# Patient Record
Sex: Female | Born: 1965 | Race: Black or African American | Hispanic: No | Marital: Married | Smoking: Never smoker
Health system: Southern US, Community
[De-identification: ages and names within clinical notes are randomized; demographics above are authoritative.]

## PROBLEM LIST (undated history)

## (undated) DIAGNOSIS — E785 Hyperlipidemia, unspecified: Secondary | ICD-10-CM

## (undated) DIAGNOSIS — E079 Disorder of thyroid, unspecified: Secondary | ICD-10-CM

## (undated) DIAGNOSIS — J45909 Unspecified asthma, uncomplicated: Secondary | ICD-10-CM

## (undated) DIAGNOSIS — D649 Anemia, unspecified: Secondary | ICD-10-CM

## (undated) DIAGNOSIS — I10 Essential (primary) hypertension: Secondary | ICD-10-CM

## (undated) DIAGNOSIS — G43909 Migraine, unspecified, not intractable, without status migrainosus: Secondary | ICD-10-CM

## (undated) DIAGNOSIS — K219 Gastro-esophageal reflux disease without esophagitis: Secondary | ICD-10-CM

## (undated) DIAGNOSIS — N189 Chronic kidney disease, unspecified: Secondary | ICD-10-CM

## (undated) HISTORY — DX: Hyperlipidemia, unspecified: E78.5

## (undated) HISTORY — PX: OTHER SURGICAL HISTORY: SHX169

## (undated) HISTORY — DX: Migraine, unspecified, not intractable, without status migrainosus: G43.909

## (undated) HISTORY — DX: Essential (primary) hypertension: I10

## (undated) HISTORY — DX: Disorder of thyroid, unspecified: E07.9

## (undated) HISTORY — DX: Gastro-esophageal reflux disease without esophagitis: K21.9

## (undated) HISTORY — PX: COLONOSCOPY: SHX174

## (undated) HISTORY — DX: Anemia, unspecified: D64.9

## (undated) HISTORY — DX: Unspecified asthma, uncomplicated: J45.909

## (undated) HISTORY — DX: Chronic kidney disease, unspecified: N18.9

---

## 2015-01-10 ENCOUNTER — Encounter: Payer: Self-pay | Admitting: Gastroenterology

## 2015-09-25 ENCOUNTER — Encounter: Payer: Self-pay | Admitting: Endocrinology

## 2015-09-25 ENCOUNTER — Ambulatory Visit (INDEPENDENT_AMBULATORY_CARE_PROVIDER_SITE_OTHER): Payer: 59 | Admitting: Endocrinology

## 2015-09-25 VITALS — BP 110/74 | HR 118 | Temp 98.6°F | Resp 14 | Ht 67.0 in | Wt 190.2 lb

## 2015-09-25 DIAGNOSIS — E890 Postprocedural hypothyroidism: Secondary | ICD-10-CM | POA: Insufficient documentation

## 2015-09-25 LAB — T3, FREE: T3 FREE: 4 pg/mL (ref 2.3–4.2)

## 2015-09-25 LAB — T4, FREE: Free T4: 1.54 ng/dL (ref 0.60–1.60)

## 2015-09-25 NOTE — Progress Notes (Addendum)
Patient ID: Heather Mcdaniel, female   DOB: [birth year], 49 y.o.   MRN: [id number]           Reason for Appointment:  Hypothyroidism, new visit    History of Present Illness:   In the late 1990s she apparently had hyperthyroidism with symptoms of feeling very tired and sleepy, 30 pound weight loss. She was treated with radioactive iodine She became hypothyroid soon after this and was started on thyroid supplements  She thinks she has been on very high doses of Synthroid or levothyroxine since the onset and has been followed by an endocrinologist previously in [place].  She has generally been feeling fairly good with her regimen of Synthroid or Generic levothyroxine over the last few years She has been followed regularly by her endocrinologist and her dose adjusted slightly at times She thinks she has been on the same dose of 450 g levothyroxine daily for about a year  The patient has been treated with brand name Synthroid previously, generic for the last year or so because of high cost of getting 2 prescriptions of the brand name at the same time She generally has prescriptions filled at [facility], either in [place] or [place]  She thinks she had thyroid levels checked less than a year ago with her endocrinologist and no change was made in her medication  Currently the patient does feel tired at times but not consistently, she thinks this is from more stress at work   Also at times they feel shaky or have palpitations She is having hot flashes and sweating which she thinks is from menopause over the last year She had lost significant amount of weight last year because of GI problems but has gained back 10 pounds in the last few months  Patient's weight history is as follows:  Wt Readings from Last 3 Encounters:  09/25/15 190 lb 3.2 oz (86.274 kg)    Thyroid function results have been as follows:  TSH done through PCP on 08/31/15 was <0.01 No other labs are available, free T4 not done  recently  No results found for: FREET4, TSH   Past Medical History  Diagnosis Date  . Hypertension   . Thyroid disease   . Asthma     History reviewed. No pertinent past surgical history.  Family History  Problem Relation Age of Onset  . Thyroid disease Mother   . Kidney disease Mother   . Thyroid disease Maternal Grandmother   . Cancer Father     Social History:  reports that she has never smoked. She has never used smokeless tobacco. Her alcohol and drug histories are not on file.  Allergies:  Allergies  Allergen Reactions  . Topamax [Topiramate] Shortness Of Breath  . Morphine And Related Anxiety      Medication List       This list is accurate as of: 09/25/15  9:41 AM.  Always use your most recent med list.               acyclovir 400 MG tablet  Commonly known as:  ZOVIRAX  Take 400 mg by mouth 2 (two) times daily.     amitriptyline 50 MG tablet  Commonly known as:  ELAVIL  TAKE 2 TABLETS IN THE EVENING AND 1/2 TABLET IN THE MORNING AS DIRECTED     diltiazem 90 MG 12 hr capsule  Commonly known as:  CARDIZEM SR  Take 90 mg by mouth 2 (two) times daily.     Flaxseed Oil  1000 MG Caps  Take by mouth.     levothyroxine 200 MCG tablet  Commonly known as:  SYNTHROID, LEVOTHROID  Take 400 mcg by mouth daily.     levothyroxine 100 MCG tablet  Commonly known as:  SYNTHROID, LEVOTHROID  Take 100 mcg by mouth daily. Takes 1/2 tablet daily     OMEGA-3 FISH OIL PO  Take by mouth.     promethazine 25 MG tablet  Commonly known as:  PHENERGAN  TAKE 1 TABLET DAILY AS NEEDED FOR NAUSEA ASSOCIATED WITH MIGRAINE     valsartan-hydrochlorothiazide 160-25 MG tablet  Commonly known as:  DIOVAN-HCT  Take 1 tablet by mouth daily.     VENTOLIN HFA 108 (90 BASE) MCG/ACT inhaler  Generic drug:  albuterol  INHALE 1-2 PUFFS EVERY DAY        Review of Systems:  Review of Systems  Constitutional: Positive for weight loss, malaise/fatigue and diaphoresis.          She had weight loss in 2015, apparently from some GI problems.  Her maximum weight previously has been 215.  In the last 2 weeks has gained back 10-12 pounds She has on and off feelings of tiredness Has had sweating spells and hot flashes for about a year, recently worse   HENT:       Occasional migraines  Eyes: Negative for blurred vision.  Respiratory: Negative for shortness of breath.   Cardiovascular: Positive for palpitations. Negative for chest pain.       Occasionally feels heart palpitations in the last year  Gastrointestinal: Negative for constipation.  Genitourinary: Negative for frequency.  Musculoskeletal: Positive for back pain.       Has mild lower back pain  Neurological: Positive for tremors and headaches.       Occasionally feels shaky  Endo/Heme/Allergies: Negative for polydipsia.  Psychiatric/Behavioral: The patient is nervous/anxious.                Examination:    BP 110/74 mmHg  Pulse 118  Temp(Src) 98.6 F (37 C)  Resp 14  Ht 5\' 7"  (1.702 m)  Wt 190 lb 3.2 oz (86.274 kg)  BMI 29.78 kg/m2  SpO2 96%  GENERAL:  Average build.  Pleasant, not anxious, well looking  No pallor, clubbing, cervical lymphadenopathy or edema.   Skin:  no rash or pigmentation.  EYES:  No prominence of the eyes or swelling of the eyelids.  No stare present.  Minimal lid lag  present.  ENT: Oral mucosa and tongue normal.  THYROID:  Not palpable.  HEART:  Normal  S1 and S2; no murmur or click.  Heart rate during exam was slightly variable with sinus arrhythmia, about 96, regular  CHEST:    Lungs: Vescicular breath sounds heard equally.  No crepitations/ wheeze.  ABDOMEN:  No distention.  Liver and spleen not palpable.  No other mass or tenderness.  NEUROLOGICAL: Reflexes are bilaterally risk at biceps.  JOINTS:  Normal.   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 She reportedly has required very large doses of thyroid supplements since onset  of her hypothyroidism Unclear why she has required large doses of thyroid supplements.  She is taking her medication appropriately before breakfast without any interacting drugs or over-the-counter supplements Apparently her dose was also about the same with brand name Synthroid  No previous records are available but she had been continued on the same dose of 450 g of levothyroxine this year by her previous endocrinologist  Currently the patient appears to have mild hyperthyroid symptoms with some palpitations, shakiness and heat intolerance Has not had any weight loss   PLAN:   Check free T4 and free T3 level today. If significantly high will need to reduce her levothyroxine by 100 g May continue Generic levothyroxine that she isn't able to afford brand name Synthroid  Follow-up in 6 weeks after dosage adjustment   Corney Knighton 09/25/2015, 9:41 AM

## 2015-09-25 NOTE — Progress Notes (Signed)
Quick Note:  Please let patient know that the thyroid level is high normal, can stop taking the extra half tablet of 100 g and continue 2 tablets of 400, follow-up as scheduled ______

## 2015-09-27 ENCOUNTER — Telehealth: Payer: Self-pay | Admitting: Endocrinology

## 2015-09-27 NOTE — Telephone Encounter (Signed)
Patient Name: Heather MontaneMALVINA SANDIDGE Gender: Female DOB: 06/27/1966 Age: 49 Y 2 M Return Phone Number: [phone] (Primary) Address: City/State/Zip: [place] Client [facility] Night - Client Client Site [facility] Contact Type Call Caller Name Same Caller Phone Number same Relationship To Patient Self Is this call to report lab results? No Call Type General Information Initial Comment Caller States Was seen yesterday and calling about blood work General Information Type Message Only

## 2015-11-06 ENCOUNTER — Ambulatory Visit: Payer: 59 | Admitting: Endocrinology

## 2018-08-03 DIAGNOSIS — E039 Hypothyroidism, unspecified: Secondary | ICD-10-CM | POA: Diagnosis not present

## 2018-08-03 DIAGNOSIS — E7800 Pure hypercholesterolemia, unspecified: Secondary | ICD-10-CM | POA: Diagnosis not present

## 2018-08-03 DIAGNOSIS — E559 Vitamin D deficiency, unspecified: Secondary | ICD-10-CM | POA: Diagnosis not present

## 2018-08-03 DIAGNOSIS — Z23 Encounter for immunization: Secondary | ICD-10-CM | POA: Diagnosis not present

## 2018-08-03 DIAGNOSIS — I10 Essential (primary) hypertension: Secondary | ICD-10-CM | POA: Diagnosis not present

## 2018-08-03 DIAGNOSIS — R7303 Prediabetes: Secondary | ICD-10-CM | POA: Diagnosis not present

## 2018-08-31 DIAGNOSIS — G4700 Insomnia, unspecified: Secondary | ICD-10-CM | POA: Diagnosis not present

## 2018-08-31 DIAGNOSIS — R7303 Prediabetes: Secondary | ICD-10-CM | POA: Diagnosis not present

## 2018-08-31 DIAGNOSIS — M9252 Juvenile osteochondrosis of tibia and fibula, left leg: Secondary | ICD-10-CM | POA: Diagnosis not present

## 2018-08-31 DIAGNOSIS — E039 Hypothyroidism, unspecified: Secondary | ICD-10-CM | POA: Diagnosis not present

## 2018-09-08 DIAGNOSIS — N183 Chronic kidney disease, stage 3 (moderate): Secondary | ICD-10-CM | POA: Diagnosis not present

## 2018-09-08 DIAGNOSIS — N2581 Secondary hyperparathyroidism of renal origin: Secondary | ICD-10-CM | POA: Diagnosis not present

## 2018-09-08 DIAGNOSIS — D631 Anemia in chronic kidney disease: Secondary | ICD-10-CM | POA: Diagnosis not present

## 2018-09-08 DIAGNOSIS — N189 Chronic kidney disease, unspecified: Secondary | ICD-10-CM | POA: Diagnosis not present

## 2018-09-08 DIAGNOSIS — I129 Hypertensive chronic kidney disease with stage 1 through stage 4 chronic kidney disease, or unspecified chronic kidney disease: Secondary | ICD-10-CM | POA: Diagnosis not present

## 2018-10-10 DIAGNOSIS — Z0000 Encounter for general adult medical examination without abnormal findings: Secondary | ICD-10-CM | POA: Diagnosis not present

## 2018-10-10 DIAGNOSIS — G43909 Migraine, unspecified, not intractable, without status migrainosus: Secondary | ICD-10-CM | POA: Diagnosis not present

## 2018-10-10 DIAGNOSIS — R300 Dysuria: Secondary | ICD-10-CM | POA: Diagnosis not present

## 2018-10-10 DIAGNOSIS — N390 Urinary tract infection, site not specified: Secondary | ICD-10-CM | POA: Diagnosis not present

## 2018-10-10 DIAGNOSIS — R109 Unspecified abdominal pain: Secondary | ICD-10-CM | POA: Diagnosis not present

## 2018-10-20 DIAGNOSIS — H1851 Endothelial corneal dystrophy: Secondary | ICD-10-CM | POA: Diagnosis not present

## 2018-11-06 DIAGNOSIS — E7800 Pure hypercholesterolemia, unspecified: Secondary | ICD-10-CM | POA: Diagnosis not present

## 2018-11-06 DIAGNOSIS — Z79899 Other long term (current) drug therapy: Secondary | ICD-10-CM | POA: Diagnosis not present

## 2018-11-06 DIAGNOSIS — E039 Hypothyroidism, unspecified: Secondary | ICD-10-CM | POA: Diagnosis not present

## 2018-11-06 DIAGNOSIS — R7303 Prediabetes: Secondary | ICD-10-CM | POA: Diagnosis not present

## 2018-11-06 DIAGNOSIS — M9252 Juvenile osteochondrosis of tibia and fibula, left leg: Secondary | ICD-10-CM | POA: Diagnosis not present

## 2019-01-11 DIAGNOSIS — R7303 Prediabetes: Secondary | ICD-10-CM | POA: Diagnosis not present

## 2019-01-11 DIAGNOSIS — I10 Essential (primary) hypertension: Secondary | ICD-10-CM | POA: Diagnosis not present

## 2019-01-11 DIAGNOSIS — E039 Hypothyroidism, unspecified: Secondary | ICD-10-CM | POA: Diagnosis not present

## 2019-01-11 DIAGNOSIS — M9252 Juvenile osteochondrosis of tibia and fibula, left leg: Secondary | ICD-10-CM | POA: Diagnosis not present

## 2019-02-06 DIAGNOSIS — M549 Dorsalgia, unspecified: Secondary | ICD-10-CM | POA: Diagnosis not present

## 2019-02-06 DIAGNOSIS — R350 Frequency of micturition: Secondary | ICD-10-CM | POA: Diagnosis not present

## 2019-02-06 DIAGNOSIS — N390 Urinary tract infection, site not specified: Secondary | ICD-10-CM | POA: Diagnosis not present

## 2019-02-06 DIAGNOSIS — R109 Unspecified abdominal pain: Secondary | ICD-10-CM | POA: Diagnosis not present

## 2019-04-12 DIAGNOSIS — Z0000 Encounter for general adult medical examination without abnormal findings: Secondary | ICD-10-CM | POA: Diagnosis not present

## 2019-04-12 DIAGNOSIS — E039 Hypothyroidism, unspecified: Secondary | ICD-10-CM | POA: Diagnosis not present

## 2019-04-12 DIAGNOSIS — Z114 Encounter for screening for human immunodeficiency virus [HIV]: Secondary | ICD-10-CM | POA: Diagnosis not present

## 2019-06-08 DIAGNOSIS — G43909 Migraine, unspecified, not intractable, without status migrainosus: Secondary | ICD-10-CM | POA: Diagnosis not present

## 2019-06-08 DIAGNOSIS — K219 Gastro-esophageal reflux disease without esophagitis: Secondary | ICD-10-CM | POA: Diagnosis not present

## 2019-06-08 DIAGNOSIS — E782 Mixed hyperlipidemia: Secondary | ICD-10-CM | POA: Diagnosis not present

## 2019-06-08 DIAGNOSIS — E039 Hypothyroidism, unspecified: Secondary | ICD-10-CM | POA: Diagnosis not present

## 2019-06-24 DIAGNOSIS — I10 Essential (primary) hypertension: Secondary | ICD-10-CM | POA: Diagnosis not present

## 2019-06-24 DIAGNOSIS — E039 Hypothyroidism, unspecified: Secondary | ICD-10-CM | POA: Diagnosis not present

## 2019-06-24 DIAGNOSIS — E782 Mixed hyperlipidemia: Secondary | ICD-10-CM | POA: Diagnosis not present

## 2019-06-24 DIAGNOSIS — N183 Chronic kidney disease, stage 3 (moderate): Secondary | ICD-10-CM | POA: Diagnosis not present

## 2019-06-24 DIAGNOSIS — Z23 Encounter for immunization: Secondary | ICD-10-CM | POA: Diagnosis not present

## 2019-07-01 DIAGNOSIS — E039 Hypothyroidism, unspecified: Secondary | ICD-10-CM | POA: Diagnosis not present

## 2019-07-01 DIAGNOSIS — K222 Esophageal obstruction: Secondary | ICD-10-CM | POA: Diagnosis not present

## 2019-07-01 DIAGNOSIS — R739 Hyperglycemia, unspecified: Secondary | ICD-10-CM | POA: Diagnosis not present

## 2019-07-01 DIAGNOSIS — K9189 Other postprocedural complications and disorders of digestive system: Secondary | ICD-10-CM | POA: Diagnosis not present

## 2019-08-04 ENCOUNTER — Telehealth: Payer: Self-pay | Admitting: Gastroenterology

## 2019-08-04 NOTE — Telephone Encounter (Signed)
DOD 07/02/19  Dr. Silverio Decamp, we have received a referral to schedule patient for a colonoscopy.  Previous EGD and colon reports from 2015 will be sent to you for review.  Please advise.

## 2019-08-09 ENCOUNTER — Telehealth: Payer: Self-pay | Admitting: Gastroenterology

## 2019-08-09 NOTE — Telephone Encounter (Signed)
Error

## 2019-08-12 DIAGNOSIS — E039 Hypothyroidism, unspecified: Secondary | ICD-10-CM | POA: Diagnosis not present

## 2019-08-24 DIAGNOSIS — I10 Essential (primary) hypertension: Secondary | ICD-10-CM | POA: Diagnosis not present

## 2019-08-24 DIAGNOSIS — E782 Mixed hyperlipidemia: Secondary | ICD-10-CM | POA: Diagnosis not present

## 2019-08-24 DIAGNOSIS — E039 Hypothyroidism, unspecified: Secondary | ICD-10-CM | POA: Diagnosis not present

## 2019-08-24 DIAGNOSIS — N183 Chronic kidney disease, stage 3 unspecified: Secondary | ICD-10-CM | POA: Diagnosis not present

## 2019-09-22 ENCOUNTER — Ambulatory Visit: Payer: BC Managed Care – PPO | Admitting: Gastroenterology

## 2019-09-22 ENCOUNTER — Encounter: Payer: Self-pay | Admitting: Gastroenterology

## 2019-09-22 VITALS — BP 140/80 | HR 80 | Temp 96.9°F | Ht 67.0 in | Wt 211.0 lb

## 2019-09-22 DIAGNOSIS — K219 Gastro-esophageal reflux disease without esophagitis: Secondary | ICD-10-CM | POA: Diagnosis not present

## 2019-09-22 DIAGNOSIS — R1310 Dysphagia, unspecified: Secondary | ICD-10-CM

## 2019-09-22 DIAGNOSIS — Z1159 Encounter for screening for other viral diseases: Secondary | ICD-10-CM | POA: Diagnosis not present

## 2019-09-22 DIAGNOSIS — Z1211 Encounter for screening for malignant neoplasm of colon: Secondary | ICD-10-CM

## 2019-09-22 DIAGNOSIS — K5900 Constipation, unspecified: Secondary | ICD-10-CM | POA: Diagnosis not present

## 2019-09-22 MED ORDER — NA SULFATE-K SULFATE-MG SULF 17.5-3.13-1.6 GM/177ML PO SOLN: ORAL | 0 refills | Status: DC

## 2019-09-22 MED ORDER — SUCRALFATE 1 G PO TABS: 1.0000 g | ORAL_TABLET | Freq: Three times a day (TID) | ORAL | 1 refills | Status: DC

## 2019-09-22 MED ORDER — LINACLOTIDE 145 MCG PO CAPS: 145.0000 ug | ORAL_CAPSULE | Freq: Every day | ORAL | 2 refills | Status: DC

## 2019-09-22 NOTE — Progress Notes (Signed)
Heather Mcdaniel    [id number]    1966/01/02  Primary Care Physician:Dewey, Mechele Claude, MD  Referring Physician: Fanny Bien, MD [address] [place],  [place] [address]   Chief complaint: Dysphagia, GERD  HPI: 53 year old female here for new patient evaluation with complaints of breakthrough heartburn and dysphagia She has history of GERD for past 5 to 6 years, her symptoms improved after EGD and she was started on omeprazole.  Dysphagia also improved after esophageal dilation but she feels her symptoms are recurring this past year and have been progressively worse in the past 6 months. Denies any odynophagia or food impaction but she regurgitates almost after every meal and has to swallow it back down.  She also regurgitates when she sleeps.  She feels she is not digesting food very well because she can still taste food that she ate long time ago. She is taking omeprazole daily but continues to have atypical chest pain and severe heartburn intermittently.  She has lactose intolerance and her diet is very restricted. She is mostly eating zen bars, popcorn, sherbert, soy milk and coffee  She has BM once a week, OTC stool softeners ([facility] twice a week BM). She has tried Linzess with no improvement  Denies any melena, rectal bleeding, abdominal pain, loss of appetite or weight loss.  She has gained weight in the past few years.  No family history of gastric cancer, colon cancer or IBD.  Reviewed E[facility] and colonoscopy [facility], performed by Dr.Sandidge.  EGD with empiric esophageal dilation September 05, 2014: LA grade B reflux esophagitis, gastric ulcer clean-based, erosive gastropathy.  Biopsies negative for H. Pylori.  EGD January 10, 2015: Normal esophagus, regular Z-line 38 cm, mild gastropathy, normal duodenum.  Colonoscopy August 18, 2014: Fair bowel prep, internal hemorrhoids, tortuous  colon otherwise unremarkable.  Recommend recall colonoscopy in 5 years  Outpatient Encounter Medications as of 09/22/2019  Medication Sig  . acyclovir (ZOVIRAX) 400 MG tablet Take 400 mg by mouth 2 (two) times daily.  Marland Kitchen amitriptyline (ELAVIL) 100 MG tablet Take 100 mg by mouth at bedtime.  Marland Kitchen amLODipine (NORVASC) 5 MG tablet Take 5 mg by mouth daily.  Marland Kitchen atorvastatin (LIPITOR) 20 MG tablet Take 1 tablet by mouth daily.  Marland Kitchen levothyroxine (SYNTHROID) 175 MCG tablet Take 175 mcg by mouth daily before breakfast.  . omeprazole (PRILOSEC) 40 MG capsule Take 1-2 capsules by mouth daily.  . promethazine (PHENERGAN) 25 MG tablet TAKE 1 TABLET DAILY AS NEEDED FOR NAUSEA ASSOCIATED WITH MIGRAINE  . VENTOLIN HFA 108 (90 BASE) MCG/ACT inhaler INHALE 1-2 PUFFS EVERY DAY  . [DISCONTINUED] amitriptyline (ELAVIL) 50 MG tablet TAKE 2 TABLETS IN THE EVENING AND 1/2 TABLET IN THE MORNING AS DIRECTED  . [DISCONTINUED] diltiazem (CARDIZEM SR) 90 MG 12 hr capsule Take 90 mg by mouth 2 (two) times daily.  . [DISCONTINUED] Flaxseed, Linseed, (FLAXSEED OIL) 1000 MG CAPS Take by mouth.  . [DISCONTINUED] levothyroxine (SYNTHROID, LEVOTHROID) 100 MCG tablet Take 100 mcg by mouth daily. Takes 1/2 tablet daily  . [DISCONTINUED] levothyroxine (SYNTHROID, LEVOTHROID) 200 MCG tablet Take 400 mcg by mouth daily.  . [DISCONTINUED] Omega-3 Fatty Acids (OMEGA-3 FISH OIL PO) Take by mouth.  . [DISCONTINUED] valsartan-hydrochlorothiazide (DIOVAN-HCT) 160-25 MG tablet Take 1 tablet by mouth daily.   No facility-administered encounter medications on file as of 09/22/2019.     Allergies as of 09/22/2019 -  Review Complete 09/22/2019  Allergen Reaction Noted  . Topamax [topiramate] Shortness Of Breath 09/25/2015  . Morphine and related Anxiety 09/25/2015    Past Medical History:  Diagnosis Date  . Asthma   . Hypertension   . Thyroid disease     No past surgical history on file.  Family History  Problem Relation Age of Onset   . Thyroid disease Mother   . Kidney disease Mother   . Heart disease Mother   . Thyroid disease Maternal Grandmother   . Cancer Father   . Heart disease Brother   . Diabetes Paternal Grandmother   . Heart disease Paternal Grandmother   . Heart disease Paternal Grandfather     Social History   Socioeconomic History  . Marital status: Married    Spouse name: Not on file  . Number of children: 2  . Years of education: Not on file  . Highest education level: Not on file  Occupational History  . Not on file  Social Needs  . Financial resource strain: Not on file  . Food insecurity    Worry: Not on file    Inability: Not on file  . Transportation needs    Medical: Not on file    Non-medical: Not on file  Tobacco Use  . Smoking status: Never Smoker  . Smokeless tobacco: Never Used  Substance and Sexual Activity  . Alcohol use: Yes    Alcohol/week: 0.0 standard drinks  . Drug use: Never  . Sexual activity: Yes  Lifestyle  . Physical activity    Days per week: Not on file    Minutes per session: Not on file  . Stress: Not on file  Relationships  . Social Musicianconnections    Talks on phone: Not on file    Gets together: Not on file    Attends religious service: Not on file    Active member of club or organization: Not on file    Attends meetings of clubs or organizations: Not on file    Relationship status: Not on file  . Intimate partner violence    Fear of current or ex partner: Not on file    Emotionally abused: Not on file    Physically abused: Not on file    Forced sexual activity: Not on file  Other Topics Concern  . Not on file  Social History Narrative  . Not on file      Review of systems: Review of Systems  Constitutional: Negative for fever and chills. Positive for fatigue HENT: Positive for allergy, sinus trouble Eyes: Negative for blurred vision.  Respiratory: Negative for cough, shortness of breath and wheezing.   Cardiovascular: Negative for  chest pain and palpitations.  Gastrointestinal: as per HPI Genitourinary: Negative for dysuria, urgency, frequency and hematuria.  Musculoskeletal: Negative for myalgias, back pain and joint pain.  Skin: Negative for itching and rash.  Neurological: Negative for dizziness, tremors, focal weakness, seizures and loss of consciousness. Positive for headaches Endo/Heme/Allergies: Positive for seasonal allergies.  Psychiatric/Behavioral: Negative for depression, suicidal ideas and hallucinations.  All other systems reviewed and are negative.   Physical Exam: Vitals:   09/22/19 1411  BP: 140/80  Pulse: 80  Temp: (!) 96.9 F (36.1 C)   Body mass index is 33.05 kg/m. Gen:      No acute distress HEENT:  EOMI, sclera anicteric Neck:     No masses; no thyromegaly Lungs:    Clear to auscultation bilaterally; normal respiratory effort CV:  Regular rate and rhythm; no murmurs Abd:      + bowel sounds; soft, non-tender; no palpable masses, no distension Ext:    No edema; adequate peripheral perfusion Skin:      Warm and dry; no rash Neuro: alert and oriented x 3 Psych: normal mood and affect  Data Reviewed:  Reviewed labs, radiology imaging, old records and pertinent past GI work up   Assessment and Plan/Recommendations:  53 year old female with history of hypertension, hyperlipidemia, hypothyroidism, chronic GERD with complaints of breakthrough heartburn and dysphagia Scheduled for EGD for evaluation and esophageal dilation if needed Continue omeprazole 40 mg daily Discussed antireflux measures and lifestyle modifications Carafate 1 g before meals and at bedtime as needed Avoid NSAIDs  Fair prep on last colonoscopy in 2015, was recommended recall in 5 years.  Will schedule for colonoscopy along with EGD for colorectal cancer screening.  Will need extended bowel prep  Chronic constipation: Start Linzess 145 mcg daily, last time she used it was almost 5 years ago unclear if it  helped her symptoms or not, will retry it. Increase dietary fiber and water intake  Continue lactose-free diet  Return in 3 months or sooner if needed  The risks and benefits as well as alternatives of endoscopic procedure(s) have been discussed and reviewed. All questions answered. The patient agrees to proceed.  Iona Beard , MD    CC: Lewis Moccasin, MD

## 2019-09-22 NOTE — Patient Instructions (Addendum)
You have been scheduled for an endoscopy and colonoscopy. Please follow the written instructions given to you at your visit today. Please pick up your prep supplies at the pharmacy within the next 1-3 days. If you use inhalers (even only as needed), please bring them with you on the day of your procedure.   Continue Omeprazole  Take Linzess 145 mcg daily  We will send in prescriptions of Carafate and Linzess   Gastroesophageal Reflux Disease, Adult Gastroesophageal reflux (GER) happens when acid from the stomach flows up into the tube that connects the mouth and the stomach (esophagus). Normally, food travels down the esophagus and stays in the stomach to be digested. However, when a person has GER, food and stomach acid sometimes move back up into the esophagus. If this becomes a more serious problem, the person may be diagnosed with a disease called gastroesophageal reflux disease (GERD). GERD occurs when the reflux:  Happens often.  Causes frequent or severe symptoms.  Causes problems such as damage to the esophagus. When stomach acid comes in contact with the esophagus, the acid may cause soreness (inflammation) in the esophagus. Over time, GERD may create small holes (ulcers) in the lining of the esophagus. What are the causes? This condition is caused by a problem with the muscle between the esophagus and the stomach (lower esophageal sphincter, or LES). Normally, the LES muscle closes after food passes through the esophagus to the stomach. When the LES is weakened or abnormal, it does not close properly, and that allows food and stomach acid to go back up into the esophagus. The LES can be weakened by certain dietary substances, medicines, and medical conditions, including:  Tobacco use.  Pregnancy.  Having a hiatal hernia.  Alcohol use.  Certain foods and beverages, such as coffee, chocolate, onions, and peppermint. What increases the risk? You are more likely to develop  this condition if you:  Have an increased body weight.  Have a connective tissue disorder.  Use NSAID medicines. What are the signs or symptoms? Symptoms of this condition include:  Heartburn.  Difficult or painful swallowing.  The feeling of having a lump in the throat.  Abitter taste in the mouth.  Bad breath.  Having a large amount of saliva.  Having an upset or bloated stomach.  Belching.  Chest pain. Different conditions can cause chest pain. Make sure you see your health care provider if you experience chest pain.  Shortness of breath or wheezing.  Ongoing (chronic) cough or a night-time cough.  Wearing away of tooth enamel.  Weight loss. How is this diagnosed? Your health care provider will take a medical history and perform a physical exam. To determine if you have mild or severe GERD, your health care provider may also monitor how you respond to treatment. You may also have tests, including:  A test to examine your stomach and esophagus with a small camera (endoscopy).  A test thatmeasures the acidity level in your esophagus.  A test thatmeasures how much pressure is on your esophagus.  A barium swallow or modified barium swallow test to show the shape, size, and functioning of your esophagus. How is this treated? The goal of treatment is to help relieve your symptoms and to prevent complications. Treatment for this condition may vary depending on how severe your symptoms are. Your health care provider may recommend:  Changes to your diet.  Medicine.  Surgery. Follow these instructions at home: Eating and drinking   Follow a diet as  recommended by your health care provider. This may involve avoiding foods and drinks such as: ? Coffee and tea (with or without caffeine). ? Drinks that containalcohol. ? Energy drinks and sports drinks. ? Carbonated drinks or sodas. ? Chocolate and cocoa. ? Peppermint and mint flavorings. ? Garlic and  onions. ? Horseradish. ? Spicy and acidic foods, including peppers, chili powder, curry powder, vinegar, hot sauces, and barbecue sauce. ? Citrus fruit juices and citrus fruits, such as oranges, lemons, and limes. ? Tomato-based foods, such as red sauce, chili, salsa, and pizza with red sauce. ? Fried and fatty foods, such as donuts, french fries, potato chips, and high-fat dressings. ? High-fat meats, such as hot dogs and fatty cuts of red and white meats, such as rib eye steak, sausage, ham, and bacon. ? High-fat dairy items, such as whole milk, butter, and cream cheese.  Eat small, frequent meals instead of large meals.  Avoid drinking large amounts of liquid with your meals.  Avoid eating meals during the 2-3 hours before bedtime.  Avoid lying down right after you eat.  Do not exercise right after you eat. Lifestyle   Do not use any products that contain nicotine or tobacco, such as cigarettes, e-cigarettes, and chewing tobacco. If you need help quitting, ask your health care provider.  Try to reduce your stress by using methods such as yoga or meditation. If you need help reducing stress, ask your health care provider.  If you are overweight, reduce your weight to an amount that is healthy for you. Ask your health care provider for guidance about a safe weight loss goal. General instructions  Pay attention to any changes in your symptoms.  Take over-the-counter and prescription medicines only as told by your health care provider. Do not take aspirin, ibuprofen, or other NSAIDs unless your health care provider told you to do so.  Wear loose-fitting clothing. Do not wear anything tight around your waist that causes pressure on your abdomen.  Raise (elevate) the head of your bed about 6 inches (15 cm).  Avoid bending over if this makes your symptoms worse.  Keep all follow-up visits as told by your health care provider. This is important. Contact a health care provider  if:  You have: ? New symptoms. ? Unexplained weight loss. ? Difficulty swallowing or it hurts to swallow. ? Wheezing or a persistent cough. ? A hoarse voice.  Your symptoms do not improve with treatment. Get help right away if you:  Have pain in your arms, neck, jaw, teeth, or back.  Feel sweaty, dizzy, or light-headed.  Have chest pain or shortness of breath.  Vomit and your vomit looks like blood or coffee grounds.  Faint.  Have stool that is bloody or black.  Cannot swallow, drink, or eat. Summary  Gastroesophageal reflux happens when acid from the stomach flows up into the esophagus. GERD is a disease in which the reflux happens often, causes frequent or severe symptoms, or causes problems such as damage to the esophagus.  Treatment for this condition may vary depending on how severe your symptoms are. Your health care provider may recommend diet and lifestyle changes, medicine, or surgery.  Contact a health care provider if you have new or worsening symptoms.  Take over-the-counter and prescription medicines only as told by your health care provider. Do not take aspirin, ibuprofen, or other NSAIDs unless your health care provider told you to do so.      Keep all follow-up visits as told  by your health care provider. This is important. This information is not intended to replace advice given to you by your health care provider. Make sure you discuss any questions you have with your health care provider. Document Released: 07/17/2005 Document Revised: 04/15/2018 Document Reviewed: 04/15/2018 Elsevier Patient Education  2020 [employer]   Due to recent COVID-19 restrictions implemented by [employer]  local and state authorities and in an effort to keep both patients and staff as safe as possible, [facility] requires COVID-19 testing prior to any scheduled endoscopic procedure. The testing center is located at [address]  [place], [place] [id number] in the [facility]  suite.  Your appointment has been scheduled for 10/06/2019 at 8:20am.   Please bring your insurance cards to this appointment. You will require your COVID screen 2 business days prior to your endoscopic procedure.  You are not required to quarantine after your screening.  You will only receive a phone call with the results if it is POSITIVE.  If you do not receive a call the day before your procedure you should begin your prep, if ordered, and you should report to the endo center for your procedure at your designated appointment arrival time ( one hour prior to the procedure time). There is no cost to you for the screening on the day of the swab.  [facility] will file with your insurance company for the testing.    You may receive an automated phone call prior to your procedure or have a message in your MyChart that you have an appointment for a BP/15 at the [facility], please disregard this message.  Your testing will be at the [address] [facility] location.   If you are leaving [facility] travel [place] on [address], turn left onto [address], turn night onto [address] at the 1st stop light turn right, pass the [employer] on your right and proceed to [address] (white building).

## 2019-09-27 ENCOUNTER — Encounter: Payer: Self-pay | Admitting: Gastroenterology

## 2019-10-01 DIAGNOSIS — Z0000 Encounter for general adult medical examination without abnormal findings: Secondary | ICD-10-CM | POA: Diagnosis not present

## 2019-10-04 DIAGNOSIS — U071 COVID-19: Secondary | ICD-10-CM | POA: Diagnosis not present

## 2019-10-07 DIAGNOSIS — E782 Mixed hyperlipidemia: Secondary | ICD-10-CM | POA: Diagnosis not present

## 2019-10-07 DIAGNOSIS — E039 Hypothyroidism, unspecified: Secondary | ICD-10-CM | POA: Diagnosis not present

## 2019-10-07 DIAGNOSIS — I10 Essential (primary) hypertension: Secondary | ICD-10-CM | POA: Diagnosis not present

## 2019-10-07 DIAGNOSIS — K219 Gastro-esophageal reflux disease without esophagitis: Secondary | ICD-10-CM | POA: Diagnosis not present

## 2019-10-08 ENCOUNTER — Encounter: Payer: BC Managed Care – PPO | Admitting: Gastroenterology

## 2019-10-16 DIAGNOSIS — Z20828 Contact with and (suspected) exposure to other viral communicable diseases: Secondary | ICD-10-CM | POA: Diagnosis not present

## 2019-10-16 DIAGNOSIS — Z03818 Encounter for observation for suspected exposure to other biological agents ruled out: Secondary | ICD-10-CM | POA: Diagnosis not present

## 2019-11-09 ENCOUNTER — Ambulatory Visit (INDEPENDENT_AMBULATORY_CARE_PROVIDER_SITE_OTHER): Payer: BC Managed Care – PPO

## 2019-11-09 ENCOUNTER — Other Ambulatory Visit: Payer: Self-pay | Admitting: Gastroenterology

## 2019-11-09 DIAGNOSIS — Z1159 Encounter for screening for other viral diseases: Secondary | ICD-10-CM | POA: Diagnosis not present

## 2019-11-09 LAB — SARS CORONAVIRUS 2 (TAT 6-24 HRS): SARS Coronavirus 2: NEGATIVE

## 2019-11-11 ENCOUNTER — Encounter: Payer: Self-pay | Admitting: Gastroenterology

## 2019-11-11 ENCOUNTER — Ambulatory Visit (AMBULATORY_SURGERY_CENTER): Payer: BC Managed Care – PPO | Admitting: Gastroenterology

## 2019-11-11 ENCOUNTER — Other Ambulatory Visit: Payer: Self-pay

## 2019-11-11 VITALS — BP 101/71 | HR 78 | Temp 98.1°F | Resp 23 | Ht 67.0 in | Wt 211.0 lb

## 2019-11-11 DIAGNOSIS — K3189 Other diseases of stomach and duodenum: Secondary | ICD-10-CM | POA: Diagnosis not present

## 2019-11-11 DIAGNOSIS — R1310 Dysphagia, unspecified: Secondary | ICD-10-CM

## 2019-11-11 DIAGNOSIS — Z1211 Encounter for screening for malignant neoplasm of colon: Secondary | ICD-10-CM | POA: Diagnosis not present

## 2019-11-11 MED ORDER — SODIUM CHLORIDE 0.9 % IV SOLN: 500.0000 mL | Freq: Once | INTRAVENOUS | Status: DC

## 2019-11-11 NOTE — Op Note (Signed)
[facility] Patient Name: Heather Mcdaniel Procedure Date: 11/11/2019 8:37 AM MRN: [id number] Endoscopist: Napoleon Form , MD Age: 54 Referring MD:  Date of Birth: [birth year] Gender: Female Account #: [id number] Procedure:                Colonoscopy Indications:              Screening for colorectal malignant neoplasm Medicines:                Monitored Anesthesia Care Procedure:                Pre-Anesthesia Assessment:                           - Prior to the procedure, a History and Physical                            was performed, and patient medications and                            allergies were reviewed. The patient's tolerance of                            previous anesthesia was also reviewed. The risks                            and benefits of the procedure and the sedation                            options and risks were discussed with the patient.                            All questions were answered, and informed consent                            was obtained. Prior Anticoagulants: The patient has                            taken no previous anticoagulant or antiplatelet                            agents. ASA Grade Assessment: II - A patient with                            mild systemic disease. After reviewing the risks                            and benefits, the patient was deemed in                            satisfactory condition to undergo the procedure.                           After obtaining informed consent, the colonoscope  was passed under direct vision. Throughout the                            procedure, the patient's blood pressure, pulse, and                            oxygen saturations were monitored continuously. The                            Colonoscope was introduced through the anus and                            advanced to the the cecum, identified by                            appendiceal orifice  and ileocecal valve. The                            colonoscopy was performed without difficulty. The                            patient tolerated the procedure well. The quality                            of the bowel preparation was adequate. The                            ileocecal valve, appendiceal orifice, and rectum                            were photographed. Scope In: 8:53:35 AM Scope Out: 9:15:30 AM Scope Withdrawal Time: 0 hours 12 minutes 28 seconds  Total Procedure Duration: 0 hours 21 minutes 55 seconds  Findings:                 The perianal and digital rectal examinations were                            normal.                           Non-bleeding internal hemorrhoids were found during                            retroflexion. The hemorrhoids were small.                           The exam was otherwise without abnormality. Complications:            No immediate complications. Estimated Blood Loss:     Estimated blood loss was minimal. Impression:               - Non-bleeding internal hemorrhoids.                           - The examination was otherwise normal.                           -  No specimens collected. Recommendation:           - Patient has a contact number available for                            emergencies. The signs and symptoms of potential                            delayed complications were discussed with the                            patient. Return to normal activities tomorrow.                            Written discharge instructions were provided to the                            patient.                           - Resume previous diet.                           - Continue present medications.                           - Repeat colonoscopy in 10 years for screening                            purposes. Napoleon Form, MD 11/11/2019 9:22:39 AM This report has been signed electronically.

## 2019-11-11 NOTE — Progress Notes (Signed)
Called to room to assist during endoscopic procedure.  Patient ID and intended procedure confirmed with present staff. Received instructions for my participation in the procedure from the performing physician.  

## 2019-11-11 NOTE — Patient Instructions (Signed)
YOU HAD AN ENDOSCOPIC PROCEDURE TODAY AT THE [facility]:   Refer to the procedure report that was given to you for any specific questions about what was found during the examination.  If the procedure report does not answer your questions, please call your gastroenterologist to clarify.  If you requested that your care partner not be given the details of your procedure findings, then the procedure report has been included in a sealed envelope for you to review at your convenience later.  YOU SHOULD EXPECT: Some feelings of bloating in the abdomen. Passage of more gas than usual.  Walking can help get rid of the air that was put into your GI tract during the procedure and reduce the bloating. If you had a lower endoscopy (such as a colonoscopy or flexible sigmoidoscopy) you may notice spotting of blood in your stool or on the toilet paper. If you underwent a bowel prep for your procedure, you may not have a normal bowel movement for a few days.  Please Note:  You might notice some irritation and congestion in your nose or some drainage.  This is from the oxygen used during your procedure.  There is no need for concern and it should clear up in a day or so.  SYMPTOMS TO REPORT IMMEDIATELY:   Following lower endoscopy (colonoscopy or flexible sigmoidoscopy):  Excessive amounts of blood in the stool  Significant tenderness or worsening of abdominal pains  Swelling of the abdomen that is new, acute  Fever of 100F or higher   Following upper endoscopy (EGD)  Vomiting of blood or coffee ground material  New chest pain or pain under the shoulder blades  Painful or persistently difficult swallowing  New shortness of breath  Fever of 100F or higher  Black, tarry-looking stools  For urgent or emergent issues, a gastroenterologist can be reached at any hour by calling [phone].   DIET:  We do recommend a small meal at first, but then you may proceed to your regular diet.  Drink  plenty of fluids but you should avoid alcoholic beverages for 24 hours.  ACTIVITY:  You should plan to take it easy for the rest of today and you should NOT DRIVE or use heavy machinery until tomorrow (because of the sedation medicines used during the test).    FOLLOW UP: Our staff will call the number listed on your records 48-72 hours following your procedure to check on you and address any questions or concerns that you may have regarding the information given to you following your procedure. If we do not reach you, we will leave a message.  We will attempt to reach you two times.  During this call, we will ask if you have developed any symptoms of COVID 19. If you develop any symptoms (ie: fever, flu-like symptoms, shortness of breath, cough etc.) before then, please call [phone].  If you test positive for Covid 19 in the 2 weeks post procedure, please call and report this information to us.    If any biopsies were taken you will be contacted by phone or by letter within the next 1-3 weeks.  Please call us at [phone] if you have not heard about the biopsies in 3 weeks.    SIGNATURES/CONFIDENTIALITY: You and/or your care partner have signed paperwork which will be entered into your electronic medical record.  These signatures attest to the fact that that the information above on your After Visit Summary has been reviewed and is   understood.  Full responsibility of the confidentiality of this discharge information lies with you and/or your care-partner. 

## 2019-11-11 NOTE — Op Note (Signed)
[name] Procedure Date: 11/11/2019 8:38 AM MRN: [id number] Endoscopist: Napoleon Form , MD Age: 54 Referring MD:  Date of Birth: [birth year] Gender: Female Account #: [id number] Procedure:                Upper GI endoscopy Indications:              Dysphagia, Esophageal reflux symptoms that persist                            despite appropriate therapy Medicines:                Monitored Anesthesia Care Procedure:                Pre-Anesthesia Assessment:                           - Prior to the procedure, a History and Physical                            was performed, and patient medications and                            allergies were reviewed. The patient's tolerance of                            previous anesthesia was also reviewed. The risks                            and benefits of the procedure and the sedation                            options and risks were discussed with the patient.                            All questions were answered, and informed consent                            was obtained. Prior Anticoagulants: The patient has                            taken no previous anticoagulant or antiplatelet                            agents. ASA Grade Assessment: II - A patient with                            mild systemic disease. After reviewing the risks                            and benefits, the patient was deemed in                            satisfactory condition to undergo the procedure.  After obtaining informed consent, the endoscope was                            passed under direct vision. Throughout the                            procedure, the patient's blood pressure, pulse, and                            oxygen saturations were monitored continuously. The                            Endoscope was introduced through the mouth, and                            advanced to the  second part of duodenum. The upper                            GI endoscopy was accomplished without difficulty.                            The patient tolerated the procedure well. Scope In: Scope Out: Findings:                 No endoscopic abnormality was evident in the                            esophagus to explain the patient's complaint of                            dysphagia. Regular Z-line at 35cm from incissors.                            Biopsies were obtained from the proximal and distal                            esophagus with cold forceps for histology of                            suspected eosinophilic esophagitis.                           The stomach was normal.                           The examined duodenum was normal. Complications:            No immediate complications. Estimated Blood Loss:     Estimated blood loss was minimal. Impression:               - No endoscopic esophageal abnormality to explain                            patient's dysphagia. Biopsied.                           -  Normal stomach.                           - Normal examined duodenum. Recommendation:           - Patient has a contact number available for                            emergencies. The signs and symptoms of potential                            delayed complications were discussed with the                            patient. Return to normal activities tomorrow.                            Written discharge instructions were provided to the                            patient.                           - Resume previous diet.                           - Continue present medications.                           - Await pathology results.                           - Follow an antireflux regimen indefinitely. Heather Pole, MD 11/11/2019 9:20:26 AM This report has been signed electronically.

## 2019-11-11 NOTE — Progress Notes (Signed)
Pt Drowsy. VSS. To PACU, report to RN. No anesthetic complications noted.  

## 2019-11-13 ENCOUNTER — Other Ambulatory Visit: Payer: Self-pay | Admitting: Gastroenterology

## 2019-11-15 ENCOUNTER — Telehealth: Payer: Self-pay | Admitting: *Deleted

## 2019-11-15 NOTE — Telephone Encounter (Signed)
  Follow up Call-  Call back number 11/11/2019  Post procedure Call Back phone  # [phone]  Permission to leave phone message Yes  Some recent data might be hidden     Patient questions:  Do you have a fever, pain , or abdominal swelling? No. Pain Score  0 *  Have you tolerated food without any problems? Yes.    Have you been able to return to your normal activities? Yes.    Do you have any questions about your discharge instructions: Diet   No. Medications  No. Follow up visit  No.  Do you have questions or concerns about your Care? No.  Actions: * If pain score is 4 or above: No action needed, pain <4.  1. Have you developed a fever since your procedure? no  2.   Have you had an respiratory symptoms (SOB or cough) since your procedure? no  3.   Have you tested positive for COVID 19 since your procedure no  4.   Have you had any family members/close contacts diagnosed with the COVID 19 since your procedure?  no   If yes to any of these questions please route to Laverna Peace, RN and Jennye Boroughs, Charity fundraiser.

## 2019-11-16 ENCOUNTER — Encounter: Payer: Self-pay | Admitting: Gastroenterology

## 2019-12-06 DIAGNOSIS — E039 Hypothyroidism, unspecified: Secondary | ICD-10-CM | POA: Diagnosis not present

## 2019-12-09 DIAGNOSIS — E039 Hypothyroidism, unspecified: Secondary | ICD-10-CM | POA: Diagnosis not present

## 2019-12-09 DIAGNOSIS — K219 Gastro-esophageal reflux disease without esophagitis: Secondary | ICD-10-CM | POA: Diagnosis not present

## 2019-12-09 DIAGNOSIS — I10 Essential (primary) hypertension: Secondary | ICD-10-CM | POA: Diagnosis not present

## 2019-12-11 ENCOUNTER — Other Ambulatory Visit: Payer: Self-pay | Admitting: Gastroenterology

## 2020-01-03 DIAGNOSIS — N183 Chronic kidney disease, stage 3 unspecified: Secondary | ICD-10-CM | POA: Diagnosis not present

## 2020-01-03 DIAGNOSIS — N189 Chronic kidney disease, unspecified: Secondary | ICD-10-CM | POA: Diagnosis not present

## 2020-01-03 DIAGNOSIS — N2581 Secondary hyperparathyroidism of renal origin: Secondary | ICD-10-CM | POA: Diagnosis not present

## 2020-01-03 DIAGNOSIS — D631 Anemia in chronic kidney disease: Secondary | ICD-10-CM | POA: Diagnosis not present

## 2020-01-03 DIAGNOSIS — I129 Hypertensive chronic kidney disease with stage 1 through stage 4 chronic kidney disease, or unspecified chronic kidney disease: Secondary | ICD-10-CM | POA: Diagnosis not present

## 2020-03-16 DIAGNOSIS — Z0000 Encounter for general adult medical examination without abnormal findings: Secondary | ICD-10-CM | POA: Diagnosis not present

## 2020-03-16 DIAGNOSIS — E782 Mixed hyperlipidemia: Secondary | ICD-10-CM | POA: Diagnosis not present

## 2020-03-16 DIAGNOSIS — Z0184 Encounter for antibody response examination: Secondary | ICD-10-CM | POA: Diagnosis not present

## 2020-03-22 DIAGNOSIS — E782 Mixed hyperlipidemia: Secondary | ICD-10-CM | POA: Diagnosis not present

## 2020-03-22 DIAGNOSIS — K589 Irritable bowel syndrome without diarrhea: Secondary | ICD-10-CM | POA: Diagnosis not present

## 2020-03-22 DIAGNOSIS — E039 Hypothyroidism, unspecified: Secondary | ICD-10-CM | POA: Diagnosis not present

## 2020-03-22 DIAGNOSIS — K219 Gastro-esophageal reflux disease without esophagitis: Secondary | ICD-10-CM | POA: Diagnosis not present

## 2020-04-05 DIAGNOSIS — E039 Hypothyroidism, unspecified: Secondary | ICD-10-CM | POA: Diagnosis not present

## 2020-04-05 DIAGNOSIS — Z23 Encounter for immunization: Secondary | ICD-10-CM | POA: Diagnosis not present

## 2020-04-05 DIAGNOSIS — E782 Mixed hyperlipidemia: Secondary | ICD-10-CM | POA: Diagnosis not present

## 2020-04-05 DIAGNOSIS — Z0000 Encounter for general adult medical examination without abnormal findings: Secondary | ICD-10-CM | POA: Diagnosis not present

## 2020-04-05 DIAGNOSIS — I10 Essential (primary) hypertension: Secondary | ICD-10-CM | POA: Diagnosis not present

## 2020-04-28 DIAGNOSIS — B001 Herpesviral vesicular dermatitis: Secondary | ICD-10-CM | POA: Diagnosis not present

## 2020-04-28 DIAGNOSIS — R232 Flushing: Secondary | ICD-10-CM | POA: Diagnosis not present

## 2020-04-28 DIAGNOSIS — G4700 Insomnia, unspecified: Secondary | ICD-10-CM | POA: Diagnosis not present

## 2020-05-15 ENCOUNTER — Other Ambulatory Visit: Payer: Self-pay | Admitting: Family Medicine

## 2020-05-15 DIAGNOSIS — Z1231 Encounter for screening mammogram for malignant neoplasm of breast: Secondary | ICD-10-CM

## 2020-05-26 ENCOUNTER — Ambulatory Visit
Admission: RE | Admit: 2020-05-26 | Discharge: 2020-05-26 | Disposition: A | Discharge status: Home / Self Care | Payer: BC Managed Care – PPO | Source: Ambulatory Visit | Attending: Family Medicine | Admitting: Family Medicine

## 2020-05-26 ENCOUNTER — Other Ambulatory Visit: Payer: Self-pay

## 2020-05-26 DIAGNOSIS — Z1231 Encounter for screening mammogram for malignant neoplasm of breast: Secondary | ICD-10-CM | POA: Diagnosis not present

## 2020-07-13 DIAGNOSIS — I10 Essential (primary) hypertension: Secondary | ICD-10-CM | POA: Diagnosis not present

## 2020-07-13 DIAGNOSIS — E039 Hypothyroidism, unspecified: Secondary | ICD-10-CM | POA: Diagnosis not present

## 2020-07-13 DIAGNOSIS — E782 Mixed hyperlipidemia: Secondary | ICD-10-CM | POA: Diagnosis not present

## 2020-07-24 DIAGNOSIS — K219 Gastro-esophageal reflux disease without esophagitis: Secondary | ICD-10-CM | POA: Diagnosis not present

## 2020-07-24 DIAGNOSIS — I10 Essential (primary) hypertension: Secondary | ICD-10-CM | POA: Diagnosis not present

## 2020-07-24 DIAGNOSIS — E039 Hypothyroidism, unspecified: Secondary | ICD-10-CM | POA: Diagnosis not present

## 2020-07-24 DIAGNOSIS — E782 Mixed hyperlipidemia: Secondary | ICD-10-CM | POA: Diagnosis not present

## 2020-07-25 ENCOUNTER — Other Ambulatory Visit: Payer: Self-pay | Admitting: Gastroenterology

## 2020-09-15 DIAGNOSIS — E039 Hypothyroidism, unspecified: Secondary | ICD-10-CM | POA: Diagnosis not present

## 2020-09-15 DIAGNOSIS — I10 Essential (primary) hypertension: Secondary | ICD-10-CM | POA: Diagnosis not present

## 2020-09-18 DIAGNOSIS — E039 Hypothyroidism, unspecified: Secondary | ICD-10-CM | POA: Diagnosis not present

## 2020-09-18 DIAGNOSIS — K219 Gastro-esophageal reflux disease without esophagitis: Secondary | ICD-10-CM | POA: Diagnosis not present

## 2020-09-18 DIAGNOSIS — N183 Chronic kidney disease, stage 3 unspecified: Secondary | ICD-10-CM | POA: Diagnosis not present

## 2020-09-18 DIAGNOSIS — I10 Essential (primary) hypertension: Secondary | ICD-10-CM | POA: Diagnosis not present

## 2020-11-14 DIAGNOSIS — E039 Hypothyroidism, unspecified: Secondary | ICD-10-CM | POA: Diagnosis not present

## 2020-11-14 DIAGNOSIS — I10 Essential (primary) hypertension: Secondary | ICD-10-CM | POA: Diagnosis not present

## 2020-11-14 DIAGNOSIS — E559 Vitamin D deficiency, unspecified: Secondary | ICD-10-CM | POA: Diagnosis not present

## 2020-11-16 DIAGNOSIS — F411 Generalized anxiety disorder: Secondary | ICD-10-CM | POA: Diagnosis not present

## 2020-11-16 DIAGNOSIS — I10 Essential (primary) hypertension: Secondary | ICD-10-CM | POA: Diagnosis not present

## 2020-11-16 DIAGNOSIS — F331 Major depressive disorder, recurrent, moderate: Secondary | ICD-10-CM | POA: Diagnosis not present

## 2020-11-16 DIAGNOSIS — E039 Hypothyroidism, unspecified: Secondary | ICD-10-CM | POA: Diagnosis not present

## 2020-11-22 DIAGNOSIS — Z23 Encounter for immunization: Secondary | ICD-10-CM | POA: Diagnosis not present

## 2021-01-10 DIAGNOSIS — E039 Hypothyroidism, unspecified: Secondary | ICD-10-CM | POA: Diagnosis not present

## 2021-01-10 DIAGNOSIS — E559 Vitamin D deficiency, unspecified: Secondary | ICD-10-CM | POA: Diagnosis not present

## 2021-01-15 DIAGNOSIS — K219 Gastro-esophageal reflux disease without esophagitis: Secondary | ICD-10-CM | POA: Diagnosis not present

## 2021-01-15 DIAGNOSIS — E039 Hypothyroidism, unspecified: Secondary | ICD-10-CM | POA: Diagnosis not present

## 2021-01-15 DIAGNOSIS — I10 Essential (primary) hypertension: Secondary | ICD-10-CM | POA: Diagnosis not present

## 2021-01-15 DIAGNOSIS — E559 Vitamin D deficiency, unspecified: Secondary | ICD-10-CM | POA: Diagnosis not present

## 2021-01-23 DIAGNOSIS — D631 Anemia in chronic kidney disease: Secondary | ICD-10-CM | POA: Diagnosis not present

## 2021-01-23 DIAGNOSIS — I129 Hypertensive chronic kidney disease with stage 1 through stage 4 chronic kidney disease, or unspecified chronic kidney disease: Secondary | ICD-10-CM | POA: Diagnosis not present

## 2021-01-23 DIAGNOSIS — N183 Chronic kidney disease, stage 3 unspecified: Secondary | ICD-10-CM | POA: Diagnosis not present

## 2021-01-23 DIAGNOSIS — N2581 Secondary hyperparathyroidism of renal origin: Secondary | ICD-10-CM | POA: Diagnosis not present

## 2021-01-23 DIAGNOSIS — N189 Chronic kidney disease, unspecified: Secondary | ICD-10-CM | POA: Diagnosis not present

## 2021-02-03 DIAGNOSIS — N390 Urinary tract infection, site not specified: Secondary | ICD-10-CM | POA: Diagnosis not present

## 2021-02-03 DIAGNOSIS — R300 Dysuria: Secondary | ICD-10-CM | POA: Diagnosis not present

## 2021-03-09 DIAGNOSIS — N183 Chronic kidney disease, stage 3 unspecified: Secondary | ICD-10-CM | POA: Diagnosis not present

## 2021-03-09 DIAGNOSIS — E039 Hypothyroidism, unspecified: Secondary | ICD-10-CM | POA: Diagnosis not present

## 2021-03-09 DIAGNOSIS — N2581 Secondary hyperparathyroidism of renal origin: Secondary | ICD-10-CM | POA: Diagnosis not present

## 2021-03-12 DIAGNOSIS — K219 Gastro-esophageal reflux disease without esophagitis: Secondary | ICD-10-CM | POA: Diagnosis not present

## 2021-03-12 DIAGNOSIS — N183 Chronic kidney disease, stage 3 unspecified: Secondary | ICD-10-CM | POA: Diagnosis not present

## 2021-03-12 DIAGNOSIS — E039 Hypothyroidism, unspecified: Secondary | ICD-10-CM | POA: Diagnosis not present

## 2021-03-12 DIAGNOSIS — I10 Essential (primary) hypertension: Secondary | ICD-10-CM | POA: Diagnosis not present

## 2021-04-11 DIAGNOSIS — Z0000 Encounter for general adult medical examination without abnormal findings: Secondary | ICD-10-CM | POA: Diagnosis not present

## 2021-04-11 DIAGNOSIS — Z23 Encounter for immunization: Secondary | ICD-10-CM | POA: Diagnosis not present

## 2021-04-11 DIAGNOSIS — R5383 Other fatigue: Secondary | ICD-10-CM | POA: Diagnosis not present

## 2021-04-11 DIAGNOSIS — R635 Abnormal weight gain: Secondary | ICD-10-CM | POA: Diagnosis not present

## 2021-04-11 DIAGNOSIS — D649 Anemia, unspecified: Secondary | ICD-10-CM | POA: Diagnosis not present

## 2021-07-06 DIAGNOSIS — E039 Hypothyroidism, unspecified: Secondary | ICD-10-CM | POA: Diagnosis not present

## 2021-07-06 DIAGNOSIS — D649 Anemia, unspecified: Secondary | ICD-10-CM | POA: Diagnosis not present

## 2021-07-06 DIAGNOSIS — E782 Mixed hyperlipidemia: Secondary | ICD-10-CM | POA: Diagnosis not present

## 2021-07-12 DIAGNOSIS — E039 Hypothyroidism, unspecified: Secondary | ICD-10-CM | POA: Diagnosis not present

## 2021-07-12 DIAGNOSIS — D649 Anemia, unspecified: Secondary | ICD-10-CM | POA: Diagnosis not present

## 2021-07-12 DIAGNOSIS — I10 Essential (primary) hypertension: Secondary | ICD-10-CM | POA: Diagnosis not present

## 2021-07-12 DIAGNOSIS — E782 Mixed hyperlipidemia: Secondary | ICD-10-CM | POA: Diagnosis not present

## 2021-07-24 DIAGNOSIS — Z23 Encounter for immunization: Secondary | ICD-10-CM | POA: Diagnosis not present

## 2021-08-20 DIAGNOSIS — E669 Obesity, unspecified: Secondary | ICD-10-CM | POA: Diagnosis not present

## 2022-01-28 IMAGING — MG DIGITAL SCREENING BILAT W/ TOMO W/ CAD
8 series · 8 of 24 positions shown · non-contrast
Comparison: None.

CLINICAL DATA: Screening.

EXAM:
DIGITAL SCREENING BILATERAL MAMMOGRAM WITH TOMO AND CAD

[L MLO synth-2D]
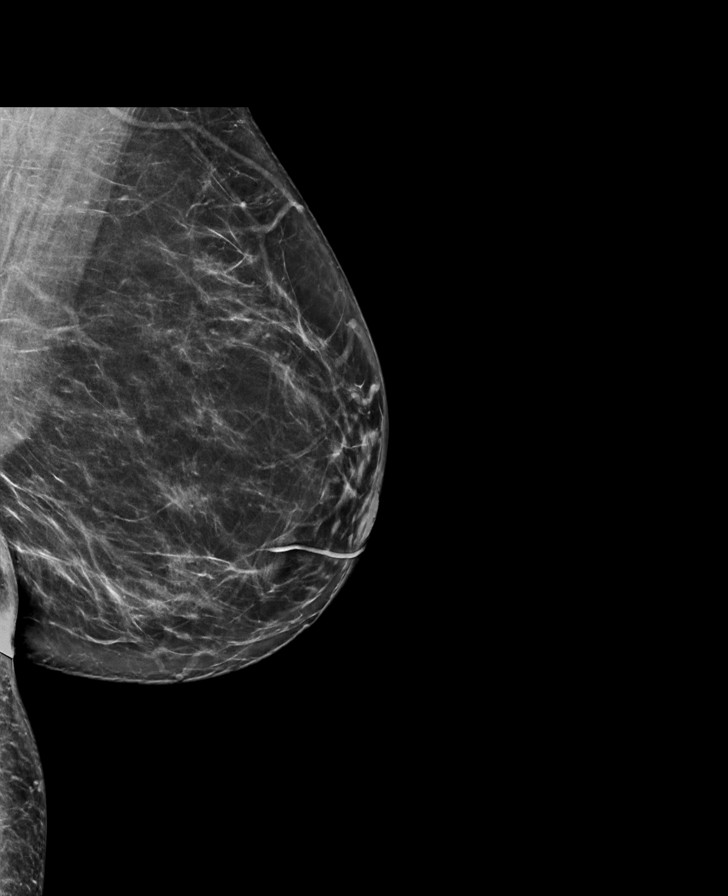

[R CC synth-2D]
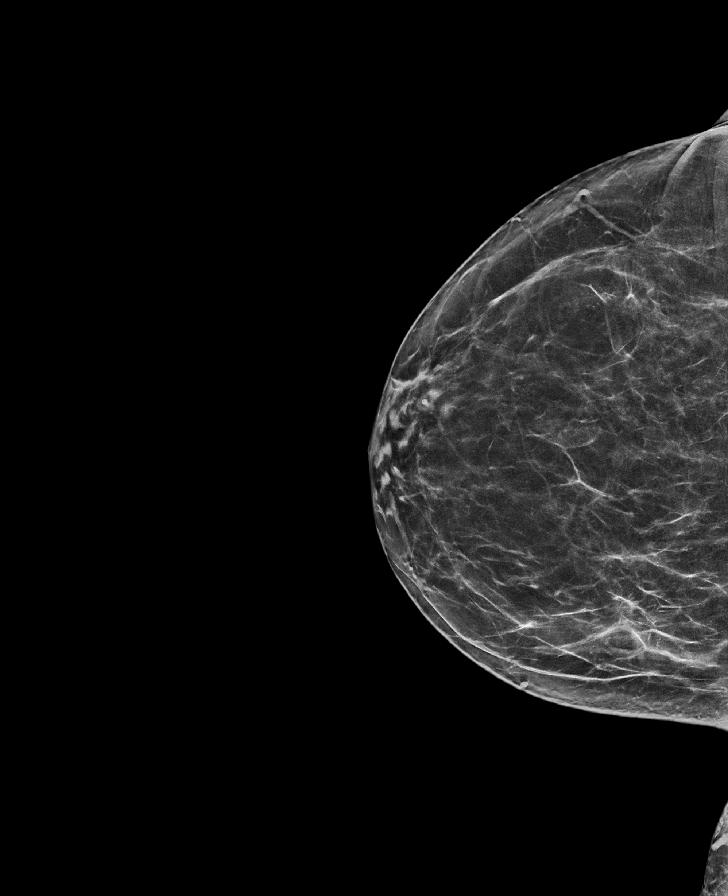

[R MLO synth-2D]
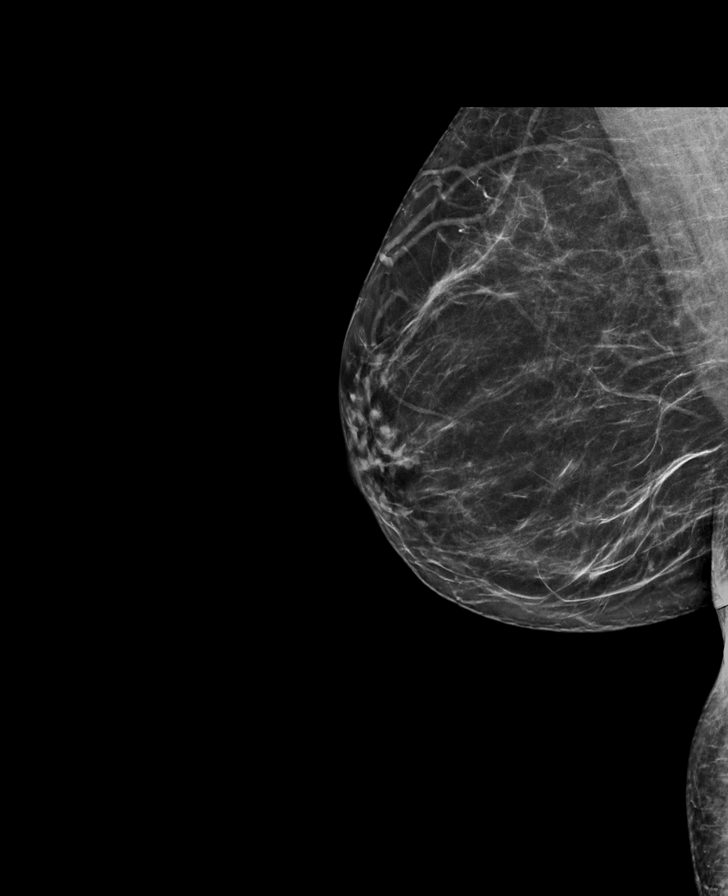

[L CC synth-2D]
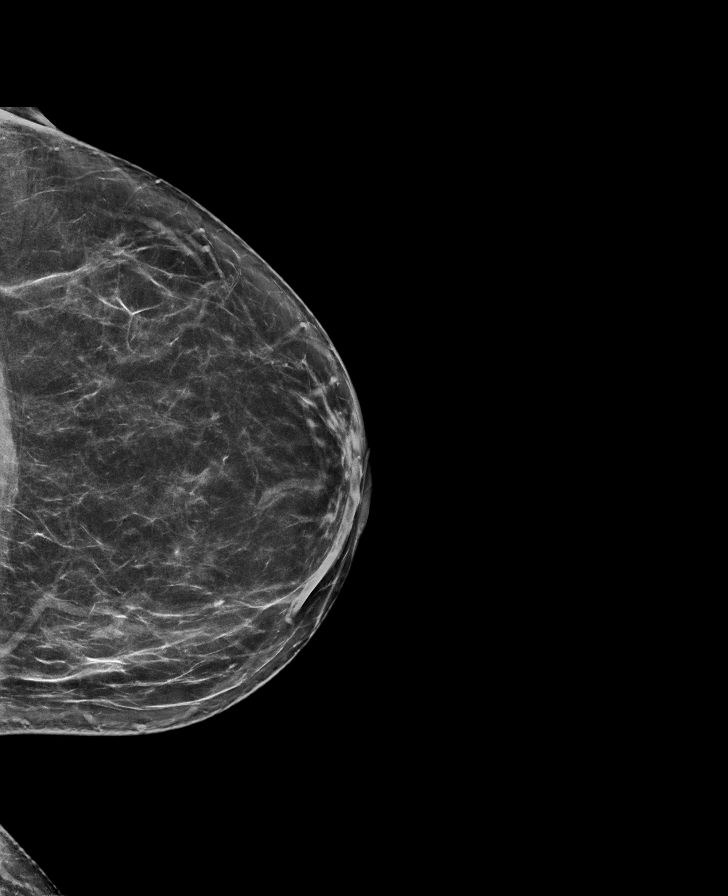

[L MLO tomo · tomo slice 37/73.0]
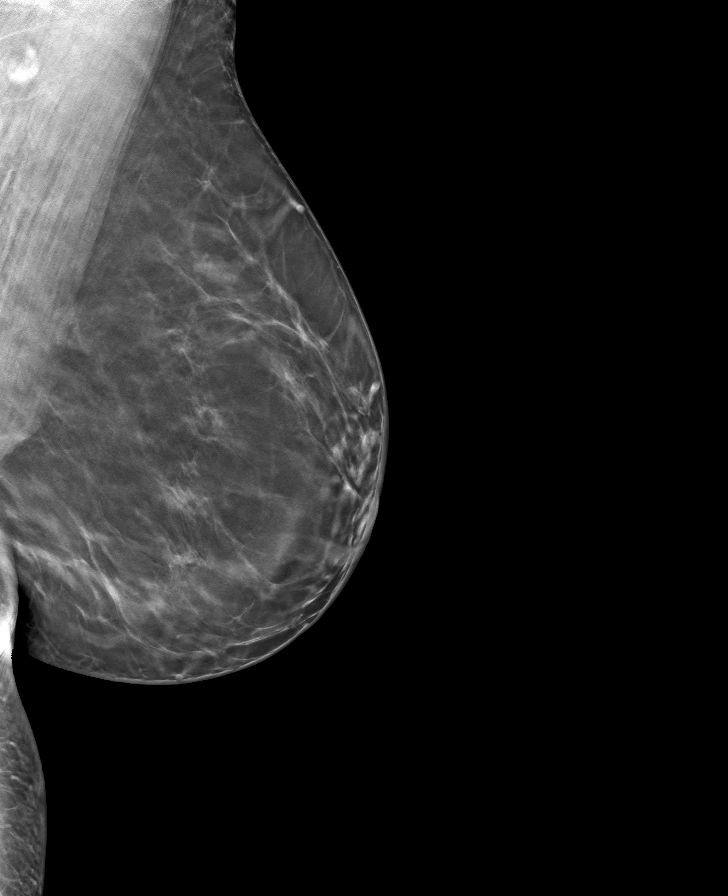

[R CC tomo · tomo slice 36/71.0]
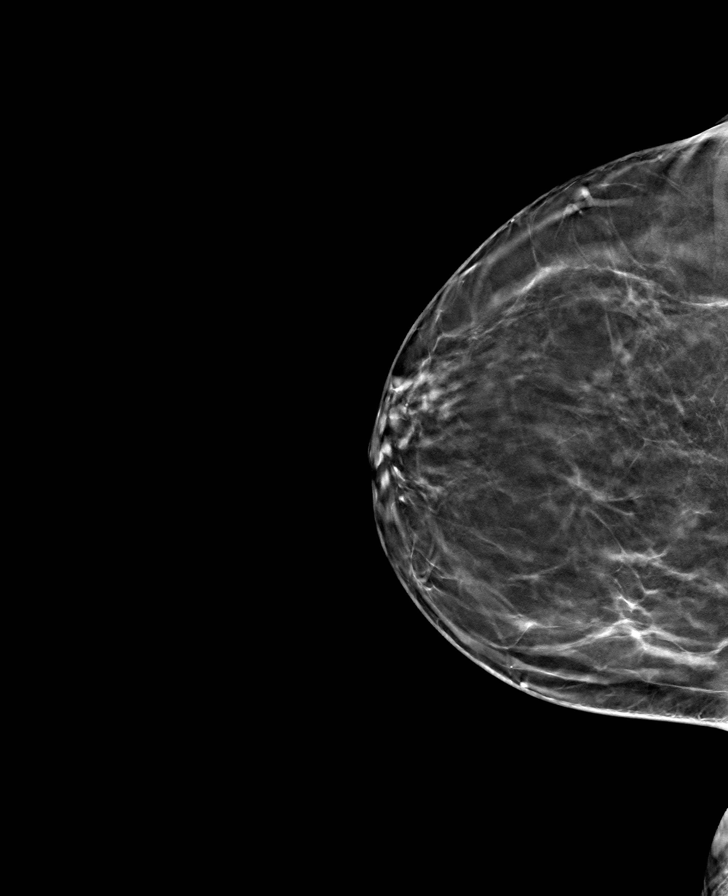

[L CC tomo · tomo slice 39/77.0]
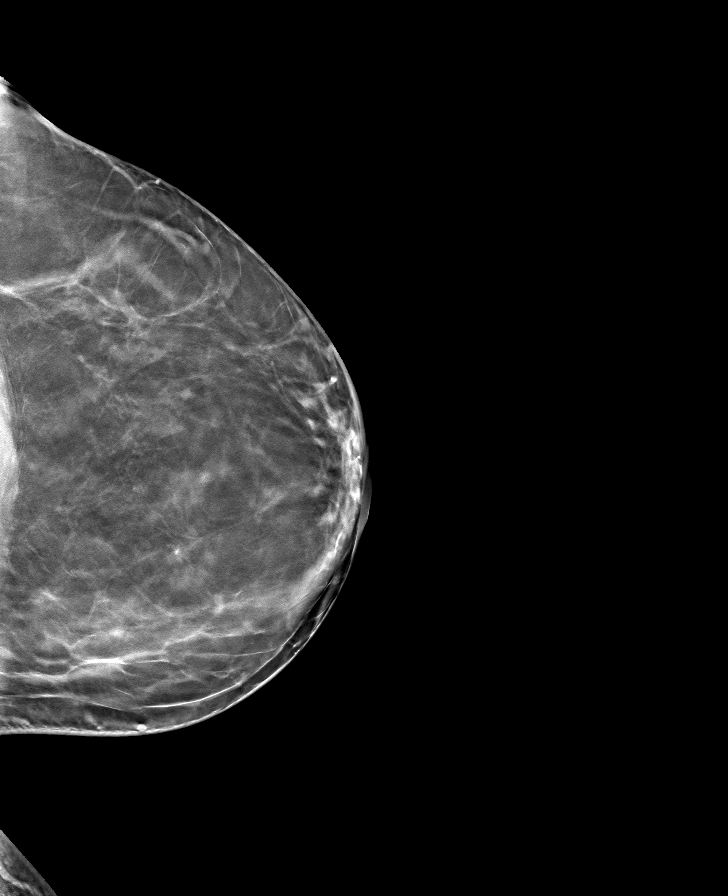

[R MLO tomo · tomo slice 35/70.0]
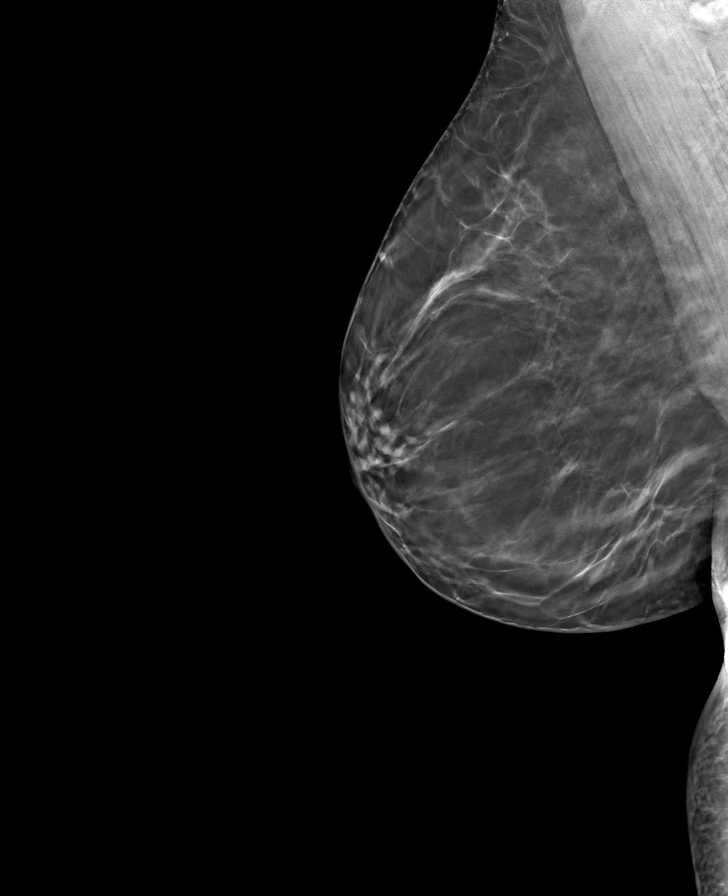

[8 of 24 positions shown; findings below may reference images not displayed]

ACR Breast Density Category b: There are scattered areas of
fibroglandular density.
FINDINGS: There are no findings suspicious for malignancy. Images were
processed with CAD.
IMPRESSION: No mammographic evidence of malignancy. A result letter of this
screening mammogram will be mailed directly to the patient.

RECOMMENDATION:
Screening mammogram in one year. (Code:Y5-G-EJ6)

BI-RADS CATEGORY  1: Negative.

## 2022-07-05 ENCOUNTER — Encounter: Payer: Self-pay | Admitting: *Deleted

## 2022-07-08 ENCOUNTER — Ambulatory Visit: Payer: BC Managed Care – PPO | Admitting: Neurology

## 2022-07-08 ENCOUNTER — Encounter: Payer: Self-pay | Admitting: Neurology

## 2022-07-08 VITALS — BP 108/78 | HR 95 | Ht 67.0 in | Wt 213.4 lb

## 2022-07-08 DIAGNOSIS — R002 Palpitations: Secondary | ICD-10-CM

## 2022-07-08 DIAGNOSIS — Z9189 Other specified personal risk factors, not elsewhere classified: Secondary | ICD-10-CM

## 2022-07-08 DIAGNOSIS — G478 Other sleep disorders: Secondary | ICD-10-CM

## 2022-07-08 DIAGNOSIS — R0683 Snoring: Secondary | ICD-10-CM

## 2022-07-08 DIAGNOSIS — E669 Obesity, unspecified: Secondary | ICD-10-CM

## 2022-07-08 DIAGNOSIS — R519 Headache, unspecified: Secondary | ICD-10-CM

## 2022-07-08 NOTE — Patient Instructions (Signed)

## 2022-07-08 NOTE — Progress Notes (Signed)
Subjective:    Patient ID: Heather Mcdaniel is a 56 y.o. female.  HPI    Star Age, MD, PhD [facility] [address] [place], [place] [address]  Dear Benjamine Mola,  I saw your patient, Heather Mcdaniel upon your kind request in my sleep clinic today for initial consultation of her sleep disorder, in particular, concern for underlying obstructive sleep apnea.  The patient is unaccompanied today.  As you know, [name] is a 56 year old right-handed woman with an underlying medical history ofn hypertension, hypothyroidism, reflux disease, asthma, irritable bowel syndrome, hyperlipidemia, chronic kidney disease (per chart review), allergic rhinitis, prediabetes, vitamin D deficiency, and obesity, who reports snoring and excessive daytime somnolence as well as morning headaches.  I reviewed your televisit note from 05/27/2022.  Her Epworth sleepiness score is 14 out of 24, fatigue severity score is 25 out of 63.  She lives with her husband but they typically sleep in separate bedrooms.  She has to have a ceiling fan and an additional fan on.  She has a humidifier.  She has had occasional morning headaches and denies any night to night nocturia.  She is not aware of any family history of sleep apnea.  She works as a Naval architect.  She drinks caffeine in the form of coffee, 3 to 4 cups/day, soda less than once a week and tea about once a week.  She is a non-smoker and drinks alcohol very rarely.  They have no pets in the household currently, she does have a TV in her bedroom but does not keep an eye on all night.  She has a history of adult onset asthma, diagnosed at age 47.  She has had nighttime palpitations.  She is a restless sleeper.  Bedtime is generally around 8 PM and rise time around 5 AM.  She has had hot flashes at night.  Her Past Medical History Is Significant For: Past Medical History:  Diagnosis Date   Anemia     Asthma    Chronic kidney disease    GERD (gastroesophageal reflux disease)    Hyperlipidemia    Hypertension    Migraine    Thyroid disease     Her Past Surgical History Is Significant For: Past Surgical History:  Procedure Laterality Date   athroscopic knee     COLONOSCOPY     EGD x 4     Tubaligation      Her Family History Is Significant For: Family History  Problem Relation Age of Onset   Thyroid disease Mother    Kidney disease Mother    Heart disease Mother    Thyroid disease Maternal Grandmother    Cancer Father    Heart disease Brother    Diabetes Paternal Grandmother    Heart disease Paternal Grandmother    Heart disease Paternal Grandfather     Her Social History Is Significant For: Social History   Socioeconomic History   Marital status: Married    Spouse name: Not on file   Number of children: 2   Years of education: Not on file   Highest education level: Not on file  Occupational History   Not on file  Tobacco Use   Smoking status: Never   Smokeless tobacco: Never  Vaping Use   Vaping Use: Never used  Substance and Sexual Activity   Alcohol use: Never    Alcohol/week: 0.0 standard drinks of alcohol   Drug use: Never   Sexual activity:  Yes  Other Topics Concern   Not on file  Social History Narrative   Caffeine coffee 3-4 cups daily,  one soda weekly.   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    Social Determinants of Health   Financial Resource Strain: Not on file  Food Insecurity: Not on file  Transportation Needs: Not on file  Physical Activity: Not on file  Stress: Not on file  Social Connections: Not on file    Her Allergies Are:  Allergies  Allergen Reactions   Topamax [Topiramate] Shortness Of Breath   Morphine And Related Anxiety  :   Her Current Medications Are:  Outpatient Encounter Medications as of 07/08/2022  Medication Sig   amitriptyline (ELAVIL) 150 MG tablet  Take 150 mg by mouth at bedtime.   amLODipine (NORVASC) 5 MG tablet Take 5 mg by mouth daily.   beclomethasone (QVAR REDIHALER) 40 MCG/ACT inhaler Inhale 2 puffs into the lungs 2 (two) times daily.   Cholecalciferol (VITAMIN D) 125 MCG (5000 UT) CAPS Take 1 capsule by mouth daily at 6 (six) AM.   Ferrous Sulfate (IRON) 325 (65 Fe) MG TABS Take 1 tablet by mouth daily.   levothyroxine (SYNTHROID) 137 MCG tablet Take 137 mcg by mouth daily before breakfast.   montelukast (SINGULAIR) 10 MG tablet Take 10 mg by mouth at bedtime.   olmesartan-hydrochlorothiazide (BENICAR HCT) 40-25 MG tablet Take 1 tablet by mouth daily.   omeprazole (PRILOSEC) 20 MG capsule Take 20 mg by mouth 2 (two) times daily before a meal.   promethazine (PHENERGAN) 25 MG tablet Take 1-2 tablets by mouth every 6 (six) hours as needed. Takes 1-2 daily   rosuvastatin (CRESTOR) 10 MG tablet Take 10 mg by mouth daily.   valACYclovir (VALTREX) 500 MG tablet Take 500 mg by mouth daily. As needed for outbreaks   VENTOLIN HFA 108 (90 BASE) MCG/ACT inhaler INHALE 1-2 PUFFS EVERY DAY   [DISCONTINUED] Rimegepant Sulfate (NURTEC) 75 MG TBDP Take by mouth. Take one daily as needed for migraine (Patient not taking: Reported on 07/08/2022)   [DISCONTINUED] Semaglutide-Weight Management (WEGOVY) 0.25 MG/0.5ML SOAJ Inject 0.25 mg into the skin. Once weekly (Patient not taking: Reported on 07/08/2022)   No facility-administered encounter medications on file as of 07/08/2022.  :   Review of Systems:  Out of a complete 14 point review of systems, all are reviewed and negative with the exception of these symptoms as listed below:  Review of Systems  Neurological:        Morning headaches, snoring, fragmented sleep. (Has hot flashes, has asthma).   No prior SS, no CPAP.    ESS 14, FSS 25.    Objective:  Neurological Exam  Physical Exam Physical Examination:   Vitals:   07/08/22 0829  BP: 108/78  Pulse: 95    General Examination: The  patient is a very pleasant 56 y.o. female in no acute distress. She appears well-developed and well-nourished and well groomed.   HEENT: Normocephalic, atraumatic, pupils are equal, round and reactive to light, extraocular tracking is good without limitation to gaze excursion or nystagmus noted. Hearing is grossly intact. Face is symmetric with normal facial animation. Speech is clear with no dysarthria noted. There is no hypophonia. There is no lip, neck/head, jaw or voice tremor. Neck is supple with full range of passive and active motion. There are no carotid bruits on auscultation. Oropharynx exam reveals: mild mouth dryness, adequate dental hygiene and moderate  airway crowding, due to Mallampati class IV.  Tonsils on the smaller side, left side easier to see compared to right.  Slightly wider tongue.  Neck circumference 14-7/8 inches.  No significant overbite.  Tongue protrudes centrally and palate elevates symmetrically.  Nasal inspection reveals mild mucosal swelling, right side more narrow than left.  No significant septal deviation.  Chest: Clear to auscultation without wheezing, rhonchi or crackles noted.  Heart: S1+S2+0, regular and normal without murmurs, rubs or gallops noted.   Abdomen: Soft, non-tender and non-distended.  Extremities: There is no pitting edema in the distal lower extremities bilaterally.   Skin: Warm and dry without trophic changes noted.   Musculoskeletal: exam reveals no obvious joint deformities.   Neurologically:  Mental status: The patient is awake, alert and oriented in all 4 spheres. His immediate and remote memory, attention, language skills and fund of knowledge are appropriate. There is no evidence of aphasia, agnosia, apraxia or anomia. Speech is clear with normal prosody and enunciation. Thought process is linear. Mood is normal and affect is normal.  Cranial nerves II - XII are as described above under HEENT exam.  Motor exam: Normal bulk, strength and  tone is noted. There is no obvious tremor.  Fine motor skills and coordination: grossly intact.  Cerebellar testing: No dysmetria or intention tremor. There is no truncal or gait ataxia.  Sensory exam: intact to light touch in the upper and lower extremities.  Gait, station and balance: She stands easily. No veering to one side is noted. No leaning to one side is noted. Posture is age-appropriate and stance is narrow based. Gait shows normal stride length and normal pace. No problems turning are noted.   Assessment and Plan:  In summary, Shrena Belmarez is a very pleasant 56 y.o.-year old female with an underlying medical history ofn hypertension, hypothyroidism, reflux disease, asthma, irritable bowel syndrome, hyperlipidemia, chronic kidney disease (per chart review), allergic rhinitis, prediabetes, vitamin D deficiency, and obesity, whose history and physical exam are concerning for sleep disordered breathing, supporting a current working diagnosis of unspecified sleep apnea, with the main differential diagnoses of obstructive sleep apnea (OSA) versus upper airway resistance syndrome (UARS) versus central sleep apnea (CSA), or mixed sleep apnea. A laboratory attended sleep study is considered gold standard for evaluation of sleep disordered breathing and is recommended at this time and clinically justified.   I had a long chat with the patient about my findings and the diagnosis of sleep apnea, particularly OSA, its prognosis and treatment options. We talked about medical/conservative treatments, surgical interventions and non-pharmacological approaches for symptom control. I explained, in particular, the risks and ramifications of untreated moderate to severe OSA, especially with respect to developing cardiovascular disease down the road, including congestive heart failure (CHF), difficult to treat hypertension, cardiac arrhythmias (particularly A-fib), neurovascular complications including TIA,  stroke and dementia. Even type 2 diabetes has, in part, been linked to untreated OSA. Symptoms of untreated OSA may include (but may not be limited to) daytime sleepiness, nocturia (i.e. frequent nighttime urination), memory problems, mood irritability and suboptimally controlled or worsening mood disorder such as depression and/or anxiety, lack of energy, lack of motivation, physical discomfort, as well as recurrent headaches, especially morning or nocturnal headaches. We talked about the importance of maintaining a healthy lifestyle and striving for healthy weight. In addition, we talked about the importance of striving for and maintaining good sleep hygiene. I recommended the following at this time: sleep study.  I outlined the differences between  a laboratory attended sleep study which is considered more comprehensive and accurate over the option of a home sleep test (HST); the latter may lead to underestimation of sleep disordered breathing in some instances and does not help with diagnosing upper airway resistance syndrome and is not accurate enough to diagnose primary central sleep apnea typically. I explained the different sleep test procedures to the patient in detail and also outlined possible surgical and non-surgical treatment options of OSA, including the use of a pressure airway pressure (PAP) device (ie CPAP, AutoPAP/APAP or BiPAP in certain circumstances), a custom-made dental device (aka oral appliance, which would require a referral to a specialist dentist or orthodontist typically, and is generally speaking not considered a good choice for patients with full dentures or edentulous state), upper airway surgical options, such as traditional UPPP (which is not considered a first-line treatment) or the Inspire device (hypoglossal nerve stimulator, which would involve a referral for consultation with an ENT surgeon, after careful selection, following inclusion criteria). I explained the PAP treatment  option to the patient in detail, as this is generally considered first-line treatment.  The patient indicated that she would be willing to try PAP therapy, if the need arises. I explained the importance of being compliant with PAP treatment, not only for insurance purposes but primarily to improve patient's symptoms symptoms, and for the patient's long term health benefit, including to reduce Her cardiovascular risks longer-term.    We will pick up our discussion about the next steps and treatment options after testing.  We will keep her posted as to the test results by phone call and/or MyChart messaging where possible.  We will plan to follow-up in sleep clinic accordingly as well.  I answered all her questions today and the patient was in agreement.   I encouraged her to call with any interim questions, concerns, problems or updates or email [place] through [place].  Generally speaking, sleep test authorizations may take up to 2 weeks, sometimes less, sometimes longer, the patient is encouraged to get in touch with [place] if they do not hear back from the sleep lab staff directly within the next 2 weeks.  Thank you very much for allowing me to participate in the care of this nice patient. If I can be of any further assistance to you please do not hesitate to call me at [phone].  Sincerely,   Star Age, MD, PhD

## 2022-07-17 ENCOUNTER — Telehealth: Payer: Self-pay | Admitting: Neurology

## 2022-07-17 NOTE — Telephone Encounter (Signed)
Mailout device-[employer] Josem Kaufmann: [id number] (exp. 07/16/22 to 09/13/22).  Mailed out on 07/18/22.

## 2022-07-18 ENCOUNTER — Ambulatory Visit: Payer: BC Managed Care – PPO | Admitting: Neurology

## 2022-07-18 DIAGNOSIS — G478 Other sleep disorders: Secondary | ICD-10-CM

## 2022-07-18 DIAGNOSIS — G4733 Obstructive sleep apnea (adult) (pediatric): Secondary | ICD-10-CM | POA: Diagnosis not present

## 2022-07-18 DIAGNOSIS — Z9189 Other specified personal risk factors, not elsewhere classified: Secondary | ICD-10-CM

## 2022-07-18 DIAGNOSIS — E669 Obesity, unspecified: Secondary | ICD-10-CM

## 2022-07-18 DIAGNOSIS — R002 Palpitations: Secondary | ICD-10-CM

## 2022-07-18 DIAGNOSIS — R0683 Snoring: Secondary | ICD-10-CM

## 2022-07-18 DIAGNOSIS — R519 Headache, unspecified: Secondary | ICD-10-CM

## 2022-07-31 NOTE — Addendum Note (Signed)
Addended by: Star Age on: 07/31/2022 05:13 PM   Modules accepted: Orders

## 2022-07-31 NOTE — Procedures (Signed)
   [facility]  HOME SLEEP TEST (Watch PAT) REPORT, mail out unit  STUDY DATE: 07/25/2022  DOB: 1966-05-08  MRN: [id number]  ORDERING CLINICIAN: Star Age, MD, PhD   REFERRING CLINICIAN: Fanny Bien, MD   CLINICAL INFORMATION/HISTORY: 56 year old female with a history of hypertension, hypothyroidism, reflux disease, asthma, irritable bowel syndrome, hyperlipidemia, chronic kidney disease (per chart review), allergic rhinitis, prediabetes, vitamin D deficiency, and obesity, who reports snoring and excessive daytime somnolence as well as morning headaches.   Epworth sleepiness score: 14/24.  BMI: 33.6 kg/m  FINDINGS:   Sleep Summary:   Total Recording Time (hours, min): 8 hours, 45 min  Total Sleep Time (hours, min):  7 hours, 15 min  Percent REM (%):    18.9%   Respiratory Indices:   Calculated pAHI (per hour):  14.6/hour         REM pAHI:    26.2/hour       NREM pAHI: 12/hour  Central pAHI: 0.7/hour  Oxygen Saturation Statistics:    Oxygen Saturation (%) Mean: 92%   Minimum oxygen saturation (%):                 82%   O2 Saturation Range (%): 82 - 98%    O2 Saturation (minutes) <=88%: 22.1 min  Pulse Rate Statistics:   Pulse Mean (bpm):    82/min    Pulse Range (60 - 102/min)   IMPRESSION: OSA (obstructive sleep apnea)   RECOMMENDATION:  This home sleep test demonstrates overall mild/near-moderate obstructive sleep apnea with a total AHI of 14.6/hour and O2 nadir of 82%. Snoring was detected, and appeared to be mild to moderate, intermittent. Given the patient's medical history and sleep related complaints, therapy with a  positive airway pressure device is a reasonable first-line choice and clinically recommended. Treatment can be achieved in the form of autoPAP trial/titration at home for now. A full night, in-lab PAP titration study may aid in improving proper treatment settings and with mask fit, if needed, down the road.  Alternative treatments may include weight loss (where appropriate) along with avoidance of the supine sleep position (if possible), or an oral appliance in appropriate candidates.   Please note that untreated obstructive sleep apnea may carry additional perioperative morbidity. Patients with significant obstructive sleep apnea should receive perioperative PAP therapy and the surgeons and particularly the anesthesiologist should be informed of the diagnosis and the severity of the sleep disordered breathing. The patient should be cautioned not to drive, work at heights, or operate dangerous or heavy equipment when tired or sleepy. Review and reiteration of good sleep hygiene measures should be pursued with any patient. Other causes of the patient's symptoms, including circadian rhythm disturbances, an underlying mood disorder, medication effect and/or an underlying medical problem cannot be ruled out based on this test. Clinical correlation is recommended.  The patient and her referring provider will be notified of the test results. The patient will be seen in follow up in sleep clinic at [facility], as necessary.  I certify that I have reviewed the raw data recording prior to the issuance of this report in accordance with the standards of the American Academy of Sleep Medicine (AASM).  INTERPRETING PHYSICIAN:   Star Age, MD, PhD  Board Certified in Neurology and Sleep Medicine  [facility] [address] [place], [place] [address] [phone]

## 2022-08-01 ENCOUNTER — Encounter: Payer: Self-pay | Admitting: *Deleted

## 2022-08-01 ENCOUNTER — Telehealth: Payer: Self-pay | Admitting: *Deleted

## 2022-08-01 NOTE — Telephone Encounter (Signed)
Called pt & LVM (ok per DPR) asking for call back to discuss her sleep study results.  

## 2022-08-01 NOTE — Telephone Encounter (Addendum)
I called the patient and we discussed her sleep study results as noted below by Dr. Rexene Alberts.  Patient is amenable to proceeding with AutoPap.  She understands the insurance compliance requirements which includes using the machine at least 4 hours at night and also being seen in the office between 31 and 90 days after set up.  She scheduled an initial follow-up for November 05 2022 at 345 pm.  She lives in [place], [place].  We discussed and we will send orders to [facility].  Patient's questions were answered and she verbalized appreciation for the call.  Referral faxed to [facility]. Received a receipt of confirmation. Result sent to referring provider.

## 2022-08-01 NOTE — Telephone Encounter (Signed)
-----   Message from Star Age, MD sent at 07/31/2022  5:13 PM EDT ----- Patient referred by Dr. Ernie Hew, seen by me on 07/08/2022, HST 07/25/2022.    Please call and notify the patient that the recent home sleep test showed obstructive sleep apnea. OSA is mild/near-moderate and worth treating to see if she feels better after treatment. To that end I recommend treatment for this in the form of autoPAP, which means, that we don't have to bring her in for a sleep study with CPAP, but will let her try an autoPAP machine at home, through a DME company (of her choice, or as per insurance requirement). The DME representative will educate her on how to use the machine, how to put the mask on, etc. I have placed an order in the chart. Please send referral, talk to patient, send report to referring MD. We will need a FU in sleep clinic for 10 weeks post-PAP set up, please arrange that with me or one of our NPs. Thanks,   Star Age, MD, PhD [facility])

## 2022-08-01 NOTE — Telephone Encounter (Signed)
Pt has called Romelle Starcher, RN back for results, please call pt.

## 2022-11-04 NOTE — Progress Notes (Unsigned)
[facility] [address] [place]. [place] [address] [phone]       OFFICE FOLLOW UP NOTE  [name] Date of Birth:  Aug 07, 1966 Medical Record Number:  [id number]   Reason for visit: Initial CPAP follow-up    SUBJECTIVE:   CHIEF COMPLAINT:  No chief complaint on file.   HPI:   Update 11/05/2022 JM: Patient returns for initial CPAP compliance visit.  Completed HST 9/28 which showed mild/near moderate OSA with total AHI of 14.6/h and recommended initiation of AutoPap.  Review of compliance report shows satisfactory usage and optimal residual AHI.  Epworth Sleepiness Scale ***/24 (prior to CPAP 14/24)        Consult visit 07/08/2022 Dr. Frances Furbish: [name] is a 57 year old right-handed woman with an underlying medical history ofn hypertension, hypothyroidism, reflux disease, asthma, irritable bowel syndrome, hyperlipidemia, chronic kidney disease (per chart review), allergic rhinitis, prediabetes, vitamin D deficiency, and obesity, who reports snoring and excessive daytime somnolence as well as morning headaches.  I reviewed your televisit note from 05/27/2022.  Her Epworth sleepiness score is 14 out of 24, fatigue severity score is 25 out of 63.  She lives with her husband but they typically sleep in separate bedrooms.  She has to have a ceiling fan and an additional fan on.  She has a humidifier.  She has had occasional morning headaches and denies any night to night nocturia.  She is not aware of any family history of sleep apnea.  She works as a Magazine features editor.  She drinks caffeine in the form of coffee, 3 to 4 cups/day, soda less than once a week and tea about once a week.  She is a non-smoker and drinks alcohol very rarely.  They have no pets in the household currently, she does have a TV in her bedroom but does not keep an eye on all night.  She has a history of adult onset asthma, diagnosed at age 74.  She has had nighttime  palpitations.  She is a restless sleeper.  Bedtime is generally around 8 PM and rise time around 5 AM.  She has had hot flashes at night.      ROS:   14 system review of systems performed and negative with exception of those listed in HPI  PMH:  Past Medical History:  Diagnosis Date   Anemia    Asthma    Chronic kidney disease    GERD (gastroesophageal reflux disease)    Hyperlipidemia    Hypertension    Migraine    Thyroid disease     PSH:  Past Surgical History:  Procedure Laterality Date   athroscopic knee     COLONOSCOPY     EGD x 4     Tubaligation      Social History:  Social History   Socioeconomic History   Marital status: Married    Spouse name: Not on file   Number of children: 2   Years of education: Not on file   Highest education level: Not on file  Occupational History   Not on file  Tobacco Use   Smoking status: Never   Smokeless tobacco: Never  Vaping Use   Vaping Use: Never used  Substance and Sexual Activity   Alcohol use: Never    Alcohol/week: 0.0 standard drinks of alcohol   Drug use: Never   Sexual activity: Yes  Other Topics Concern   Not on file  Social History Narrative   Caffeine coffee 3-4  cups daily,  one soda weekly.   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    Social Determinants of Health   Financial Resource Strain: Not on file  Food Insecurity: Not on file  Transportation Needs: Not on file  Physical Activity: Not on file  Stress: Not on file  Social Connections: Not on file  Intimate Partner Violence: Not on file    Family History:  Family History  Problem Relation Age of Onset   Thyroid disease Mother    Kidney disease Mother    Heart disease Mother    Thyroid disease Maternal Grandmother    Cancer Father    Heart disease Brother    Diabetes Paternal Grandmother    Heart disease Paternal Grandmother    Heart disease Paternal Grandfather      Medications:   Current Outpatient Medications on File Prior to Visit  Medication Sig Dispense Refill   amitriptyline (ELAVIL) 150 MG tablet Take 150 mg by mouth at bedtime.     amLODipine (NORVASC) 5 MG tablet Take 5 mg by mouth daily.     beclomethasone (QVAR REDIHALER) 40 MCG/ACT inhaler Inhale 2 puffs into the lungs 2 (two) times daily.     Cholecalciferol (VITAMIN D) 125 MCG (5000 UT) CAPS Take 1 capsule by mouth daily at 6 (six) AM.     Ferrous Sulfate (IRON) 325 (65 Fe) MG TABS Take 1 tablet by mouth daily.     levothyroxine (SYNTHROID) 137 MCG tablet Take 137 mcg by mouth daily before breakfast.     montelukast (SINGULAIR) 10 MG tablet Take 10 mg by mouth at bedtime.     olmesartan-hydrochlorothiazide (BENICAR HCT) 40-25 MG tablet Take 1 tablet by mouth daily.     omeprazole (PRILOSEC) 20 MG capsule Take 20 mg by mouth 2 (two) times daily before a meal.     promethazine (PHENERGAN) 25 MG tablet Take 1-2 tablets by mouth every 6 (six) hours as needed. Takes 1-2 daily  2   rosuvastatin (CRESTOR) 10 MG tablet Take 10 mg by mouth daily.     valACYclovir (VALTREX) 500 MG tablet Take 500 mg by mouth daily. As needed for outbreaks     VENTOLIN HFA 108 (90 BASE) MCG/ACT inhaler INHALE 1-2 PUFFS EVERY DAY  3   No current facility-administered medications on file prior to visit.    Allergies:   Allergies  Allergen Reactions   Topamax [Topiramate] Shortness Of Breath   Morphine And Related Anxiety      OBJECTIVE:  Physical Exam  There were no vitals filed for this visit. There is no height or weight on file to calculate BMI. No results found.   General: well developed, well nourished, seated, in no evident distress Head: head normocephalic and atraumatic.   Neck: supple with no carotid or supraclavicular bruits Cardiovascular: regular rate and rhythm, no murmurs Musculoskeletal: no deformity Skin:  no rash/petichiae Vascular:  Normal pulses all extremities   Neurologic  Exam Mental Status: Awake and fully alert. Oriented to place and time. Recent and remote memory intact. Attention span, concentration and fund of knowledge appropriate. Mood and affect appropriate.  Cranial Nerves: Pupils equal, briskly reactive to light. Extraocular movements full without nystagmus. Visual fields full to confrontation. Hearing intact. Facial sensation intact. Face, tongue, palate moves normally and symmetrically.  Motor: Normal bulk and tone. Normal strength in all tested extremity muscles Sensory.: intact to touch , pinprick , position and vibratory sensation.  Coordination:  Rapid alternating movements normal in all extremities. Finger-to-nose and heel-to-shin performed accurately bilaterally. Gait and Station: Arises from chair without difficulty. Stance is normal. Gait demonstrates normal stride length and balance without use of AD. Tandem walk and heel toe without difficulty.  Reflexes: 1+ and symmetric. Toes downgoing.         ASSESSMENT/PLAN: Heather Mcdaniel is a 57 y.o. year old female    OSA on CPAP : Compliance report shows satisfactory usage with optimal residual AHI.  Discussed continued nightly usage with ensuring greater than 4 hours nightly for optimal benefit and per insurance purposes.  Continue to follow with DME company for any needed supplies or CPAP related concerns     Follow up in *** or call earlier if needed   CC:  PCP: Fanny Bien, MD    I spent *** minutes of face-to-face and non-face-to-face time with patient.  This included previsit chart review, lab review, study review, order entry, electronic health record documentation, patient education regarding diagnosis of sleep apnea with review and discussion of compliance report and answered all other questions to patient's satisfaction   Frann Rider, [facility]  [facility] [address] [place], [place] [address]  Phone [phone] Fax  [phone] Note: This document was prepared with digital dictation and possible smart phrase technology. Any transcriptional errors that result from this process are unintentional.

## 2022-11-05 ENCOUNTER — Ambulatory Visit: Payer: BC Managed Care – PPO | Admitting: Adult Health

## 2022-11-05 ENCOUNTER — Encounter: Payer: Self-pay | Admitting: Adult Health

## 2022-11-05 VITALS — BP 139/94 | HR 83 | Ht 67.0 in | Wt 222.2 lb

## 2022-11-05 DIAGNOSIS — G4733 Obstructive sleep apnea (adult) (pediatric): Secondary | ICD-10-CM

## 2022-11-05 NOTE — Patient Instructions (Signed)
Your Plan:  Continue nightly use of CPAP for sleep apnea management   Continue to follow with [facility] for ongoing supplies and CPAP related concerns     Follow up visit in 6 months or call earlier if needed     Thank you for coming to see [place] at [facility]. I hope we have been able to provide you high quality care today.  You may receive a patient satisfaction survey over the next few weeks. We would appreciate your feedback and comments so that we may continue to improve ourselves and the health of our patients.

## 2023-05-06 ENCOUNTER — Telehealth: Payer: BC Managed Care – PPO | Admitting: Adult Health

## 2023-05-19 NOTE — Progress Notes (Deleted)
[facility] [address] [place]. [place] [address] [phone]       OFFICE FOLLOW UP NOTE  [name] Date of Birth:  [birth year] Medical Record Number:  [id number]   Reason for visit: Initial CPAP follow-up    SUBJECTIVE:   CHIEF COMPLAINT:  No chief complaint on file.  Follow-up visit:  Prior visit: 11/05/2022  Brief HPI:   Heather Mcdaniel is a 57 y.o. female who was initially seen by Dr. Frances Mcdaniel on 07/08/2022 for concern of underlying sleep apnea. Completed HST 9/28 which showed mild/near moderate OSA with total AHI of 14.6/h.  AutoPap set up date 08/26/2022.  At prior visit, compliance report shows satisfactory usage and optimal residual AHI.  Was having some issues with intolerance   Interval history:  Compliance report shows satisfactory usage and optimal residual AHI.    Epworth Sleepiness Scale 4/24 today, prior to CPAP 14/24.             ROS:   14 system review of systems performed and negative with exception of those listed in HPI  PMH:  Past Medical History:  Diagnosis Date   Anemia    Asthma    Chronic kidney disease    GERD (gastroesophageal reflux disease)    Hyperlipidemia    Hypertension    Migraine    Thyroid disease     PSH:  Past Surgical History:  Procedure Laterality Date   athroscopic knee     COLONOSCOPY     EGD x 4     Tubaligation      Social History:  Social History   Socioeconomic History   Marital status: Married    Spouse name: Not on file   Number of children: 2   Years of education: Not on file   Highest education level: Not on file  Occupational History   Not on file  Tobacco Use   Smoking status: Never   Smokeless tobacco: Never  Vaping Use   Vaping status: Never Used  Substance and Sexual Activity   Alcohol use: Never    Alcohol/week: 0.0 standard drinks of alcohol   Drug use: Never   Sexual activity: Yes  Other Topics Concern   Not on file  Social  History Narrative   Caffeine coffee 3-4 cups daily,  one soda weekly.   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    Social Determinants of Health   Financial Resource Strain: Not on file  Food Insecurity: Not on file  Transportation Needs: Not on file  Physical Activity: Not on file  Stress: Not on file  Social Connections: Not on file  Intimate Partner Violence: Not on file    Family History:  Family History  Problem Relation Age of Onset   Thyroid disease Mother    Kidney disease Mother    Heart disease Mother    Thyroid disease Maternal Grandmother    Cancer Father    Heart disease Brother    Diabetes Paternal Grandmother    Heart disease Paternal Grandmother    Heart disease Paternal Grandfather     Medications:   Current Outpatient Medications on File Prior to Visit  Medication Sig Dispense Refill   amitriptyline (ELAVIL) 150 MG tablet Take 150 mg by mouth at bedtime.     amLODipine (NORVASC) 5 MG tablet Take 5 mg by mouth daily.     beclomethasone (QVAR REDIHALER) 40 MCG/ACT inhaler Inhale 2 puffs into the lungs  2 (two) times daily.     Cholecalciferol (VITAMIN D) 125 MCG (5000 UT) CAPS Take 1 capsule by mouth daily at 6 (six) AM.     Ferrous Sulfate (IRON) 325 (65 Fe) MG TABS Take 1 tablet by mouth daily.     levothyroxine (SYNTHROID) 137 MCG tablet Take 137 mcg by mouth daily before breakfast.     montelukast (SINGULAIR) 10 MG tablet Take 10 mg by mouth at bedtime.     olmesartan-hydrochlorothiazide (BENICAR HCT) 40-25 MG tablet Take 1 tablet by mouth daily.     omeprazole (PRILOSEC) 20 MG capsule Take 20 mg by mouth 2 (two) times daily before a meal.     promethazine (PHENERGAN) 25 MG tablet Take 1-2 tablets by mouth every 6 (six) hours as needed. Takes 1-2 daily  2   rosuvastatin (CRESTOR) 10 MG tablet Take 10 mg by mouth daily.     valACYclovir (VALTREX) 500 MG tablet Take 500 mg by mouth daily. As needed for  outbreaks     VENTOLIN HFA 108 (90 BASE) MCG/ACT inhaler INHALE 1-2 PUFFS EVERY DAY  3   No current facility-administered medications on file prior to visit.    Allergies:   Allergies  Allergen Reactions   Topamax [Topiramate] Shortness Of Breath   Morphine And Codeine Anxiety      OBJECTIVE:  Physical Exam  There were no vitals filed for this visit.  There is no height or weight on file to calculate BMI. No results found.   General: well developed, well nourished, very pleasant middle-age female, seated, in no evident distress Head: head normocephalic and atraumatic.   Neck: supple with no carotid or supraclavicular bruits Cardiovascular: regular rate and rhythm, no murmurs Musculoskeletal: no deformity Skin:  no rash/petichiae Vascular:  Normal pulses all extremities   Neurologic Exam Mental Status: Awake and fully alert. Oriented to place and time. Recent and remote memory intact. Attention span, concentration and fund of knowledge appropriate. Mood and affect appropriate.  Cranial Nerves: Pupils equal, briskly reactive to light. Extraocular movements full without nystagmus. Visual fields full to confrontation. Hearing intact. Facial sensation intact. Face, tongue, palate moves normally and symmetrically.  Motor: Normal bulk and tone. Normal strength in all tested extremity muscles Sensory.: intact to touch , pinprick , position and vibratory sensation.  Coordination: Rapid alternating movements normal in all extremities. Finger-to-nose and heel-to-shin performed accurately bilaterally. Gait and Station: Arises from chair without difficulty. Stance is normal. Gait demonstrates normal stride length and balance without use of AD.  Reflexes: 1+ and symmetric. Toes downgoing.         ASSESSMENT/PLAN: Heather Mcdaniel is a 57 y.o. year old female    OSA on CPAP : Compliance report shows satisfactory usage with optimal residual AHI.  Continue current pressure  settings.  Discussed ways to help improve tolerance and desensitization.  Discussed continued nightly usage with ensuring greater than 4 hours nightly for optimal benefit and per insurance purposes.  Continue to follow with DME company for any needed supplies or CPAP related concerns     Follow up in 7 months or call earlier if needed   CC:  PCP: Lewis Moccasin, MD    I spent 21 minutes of face-to-face and non-face-to-face time with patient.  This included previsit chart review, lab review, study review, order entry, electronic health record documentation, patient education regarding diagnosis of sleep apnea with review and discussion of compliance report and answered all other questions to patient's satisfaction   Shanda Bumps  Nigel Bridgeman  [facility] [address] [place], [place] [address]  Phone [phone] Fax [phone] Note: This document was prepared with digital dictation and possible smart phrase technology. Any transcriptional errors that result from this process are unintentional.

## 2023-05-20 ENCOUNTER — Ambulatory Visit: Payer: BC Managed Care – PPO | Admitting: Adult Health

## 2023-07-22 ENCOUNTER — Ambulatory Visit: Payer: BC Managed Care – PPO | Admitting: Adult Health

## 2023-07-22 NOTE — Progress Notes (Deleted)
[facility] [address] [place]. [place] [address] [phone]       OFFICE FOLLOW UP NOTE  [name] Date of Birth:  [birth year] Medical Record Number:  [id number]   Reason for visit: Initial CPAP follow-up    SUBJECTIVE:   CHIEF COMPLAINT:  No chief complaint on file.   Brief HPI:   Heather Mcdaniel is a 57 y.o. female who was initially evaluated by Dr. Frances Furbish on 07/08/2022 for concern of underlying sleep apnea with reports of snoring, excessive daytime somnolence and morning headaches.  PMH significant for HTN, HLD, pre-DM, hypothyroidism, CKD and obesity.  ESS 14/24. FSS 25/63. HST 07/18/2022 showed mild/near moderate OSA with total AHI of 14.6/h and O2 nadir of 82%.  AutoPap initiated 08/26/2022.  At prior visit, compliance report showed satisfactory usage and optimal residual AHI. C/o removing mask while sleeping and tubing could be bothersome.  Discussed ways to help improve tolerance and desensitization.  Denies much improvement of daytime energy levels although ESS decreased from 14/24 to 4/24.     Interval history:  Compliance report shows optimal usage and residual AHI.             ROS:   14 system review of systems performed and negative with exception of those listed in HPI  PMH:  Past Medical History:  Diagnosis Date   Anemia    Asthma    Chronic kidney disease    GERD (gastroesophageal reflux disease)    Hyperlipidemia    Hypertension    Migraine    Thyroid disease     PSH:  Past Surgical History:  Procedure Laterality Date   athroscopic knee     COLONOSCOPY     EGD x 4     Tubaligation      Social History:  Social History   Socioeconomic History   Marital status: Married    Spouse name: Not on file   Number of children: 2   Years of education: Not on file   Highest education level: Not on file  Occupational History   Not on file  Tobacco Use   Smoking status: Never   Smokeless  tobacco: Never  Vaping Use   Vaping status: Never Used  Substance and Sexual Activity   Alcohol use: Never    Alcohol/week: 0.0 standard drinks of alcohol   Drug use: Never   Sexual activity: Yes  Other Topics Concern   Not on file  Social History Narrative   Caffeine coffee 3-4 cups daily,  one soda weekly.   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    Social Determinants of Health   Financial Resource Strain: Not on file  Food Insecurity: Not on file  Transportation Needs: Not on file  Physical Activity: Not on file  Stress: Not on file  Social Connections: Not on file  Intimate Partner Violence: Not on file    Family History:  Family History  Problem Relation Age of Onset   Thyroid disease Mother    Kidney disease Mother    Heart disease Mother    Thyroid disease Maternal Grandmother    Cancer Father    Heart disease Brother    Diabetes Paternal Grandmother    Heart disease Paternal Grandmother    Heart disease Paternal Grandfather     Medications:   Current Outpatient Medications on File Prior to Visit  Medication Sig Dispense Refill   amitriptyline (ELAVIL) 150 MG tablet Take  150 mg by mouth at bedtime.     amLODipine (NORVASC) 5 MG tablet Take 5 mg by mouth daily.     beclomethasone (QVAR REDIHALER) 40 MCG/ACT inhaler Inhale 2 puffs into the lungs 2 (two) times daily.     Cholecalciferol (VITAMIN D) 125 MCG (5000 UT) CAPS Take 1 capsule by mouth daily at 6 (six) AM.     Ferrous Sulfate (IRON) 325 (65 Fe) MG TABS Take 1 tablet by mouth daily.     levothyroxine (SYNTHROID) 137 MCG tablet Take 137 mcg by mouth daily before breakfast.     montelukast (SINGULAIR) 10 MG tablet Take 10 mg by mouth at bedtime.     olmesartan-hydrochlorothiazide (BENICAR HCT) 40-25 MG tablet Take 1 tablet by mouth daily.     omeprazole (PRILOSEC) 20 MG capsule Take 20 mg by mouth 2 (two) times daily before a meal.     promethazine  (PHENERGAN) 25 MG tablet Take 1-2 tablets by mouth every 6 (six) hours as needed. Takes 1-2 daily  2   rosuvastatin (CRESTOR) 10 MG tablet Take 10 mg by mouth daily.     valACYclovir (VALTREX) 500 MG tablet Take 500 mg by mouth daily. As needed for outbreaks     VENTOLIN HFA 108 (90 BASE) MCG/ACT inhaler INHALE 1-2 PUFFS EVERY DAY  3   No current facility-administered medications on file prior to visit.    Allergies:   Allergies  Allergen Reactions   Topamax [Topiramate] Shortness Of Breath   Morphine And Codeine Anxiety      OBJECTIVE:  Physical Exam  There were no vitals filed for this visit.  There is no height or weight on file to calculate BMI. No results found.   General: well developed, well nourished, very pleasant middle-age female, seated, in no evident distress Head: head normocephalic and atraumatic.   Neck: supple with no carotid or supraclavicular bruits Cardiovascular: regular rate and rhythm, no murmurs Musculoskeletal: no deformity Skin:  no rash/petichiae Vascular:  Normal pulses all extremities   Neurologic Exam Mental Status: Awake and fully alert. Oriented to place and time. Recent and remote memory intact. Attention span, concentration and fund of knowledge appropriate. Mood and affect appropriate.  Cranial Nerves: Pupils equal, briskly reactive to light. Extraocular movements full without nystagmus. Visual fields full to confrontation. Hearing intact. Facial sensation intact. Face, tongue, palate moves normally and symmetrically.  Motor: Normal bulk and tone. Normal strength in all tested extremity muscles Sensory.: intact to touch , pinprick , position and vibratory sensation.  Coordination: Rapid alternating movements normal in all extremities. Finger-to-nose and heel-to-shin performed accurately bilaterally. Gait and Station: Arises from chair without difficulty. Stance is normal. Gait demonstrates normal stride length and balance without use of AD.   Reflexes: 1+ and symmetric. Toes downgoing.         ASSESSMENT/PLAN: Heather Mcdaniel is a 57 y.o. year old female    OSA on CPAP : Compliance report shows satisfactory usage with optimal residual AHI.  Continue current pressure settings.  Discussed ways to help improve tolerance and desensitization.  Discussed continued nightly usage with ensuring greater than 4 hours nightly for optimal benefit and per insurance purposes.  Continue to follow with DME company for any needed supplies or CPAP related concerns     Follow up in 7 months or call earlier if needed   CC:  PCP: Lewis Moccasin, MD    I spent 21 minutes of face-to-face and non-face-to-face time with patient.  This included previsit  chart review, lab review, study review, order entry, electronic health record documentation, patient education regarding diagnosis of sleep apnea with review and discussion of compliance report and answered all other questions to patient's satisfaction   Ihor Austin, [facility]  [facility] [address] [place], [place] [address]  Phone [phone] Fax [phone] Note: This document was prepared with digital dictation and possible smart phrase technology. Any transcriptional errors that result from this process are unintentional.

## 2023-12-01 DIAGNOSIS — I219 Acute myocardial infarction, unspecified: Secondary | ICD-10-CM

## 2023-12-01 HISTORY — DX: Acute myocardial infarction, unspecified: I21.9

## 2024-01-21 DIAGNOSIS — Z0289 Encounter for other administrative examinations: Secondary | ICD-10-CM

## 2024-01-23 NOTE — Progress Notes (Signed)
 [facility] [address] [place]. [place] [address] [phone]       OFFICE FOLLOW UP NOTE  [name] Date of Birth:  20-Jun-1966 Medical Record Number:  [id number]    Primary neurologist: Dr. Frances Furbish Reason for visit: CPAP follow-up    SUBJECTIVE:   CHIEF COMPLAINT:  Chief Complaint  Patient presents with   Sleep Apnea    Rm 3 alone Pt is well and stable reports no new concerns with OSA/CPAP.     Brief HPI:   Heather Mcdaniel is a 58 y.o. female who was initially evaluated by Dr. Frances Furbish in 06/2022 for concern of underlying sleep apnea. ESS 14/24. Completed HST 06/2022 which showed mild/near moderate OSA with total AHI of 14.6/h and recommended initiation of AutoPap.     Interval history:  Patient returns for CPAP follow-up visit.  Continues to struggle with tolerance but does feel this has improved compared to prior visit.  At times will still remove in the middle of the night while sleeping.  Some nights she feels more rested than others despite using CPAP.  Continues to use nasal pillow mask, she has tried different type of nasal pillows, does not believe she would tolerate FFM d/t claustrophobia. ESS 10/24.  Does report having an MI in 11/2023, evaluated at [facility] in [place] and is now being followed by cardiology.         ROS:   14 system review of systems performed and negative with exception of those listed in HPI  PMH:  Past Medical History:  Diagnosis Date   Anemia    Asthma    Chronic kidney disease    GERD (gastroesophageal reflux disease)    Heart attack (HCC) 12/01/2023   Hyperlipidemia    Hypertension    Migraine    Thyroid disease     PSH:  Past Surgical History:  Procedure Laterality Date   athroscopic knee     COLONOSCOPY     EGD x 4     Tubaligation      Social History:  Social History   Socioeconomic History   Marital status: Married    Spouse name: Not on file   Number of  children: 2   Years of education: Not on file   Highest education level: Not on file  Occupational History   Not on file  Tobacco Use   Smoking status: Never   Smokeless tobacco: Never  Vaping Use   Vaping status: Never Used  Substance and Sexual Activity   Alcohol use: Never    Alcohol/week: 0.0 standard drinks of alcohol   Drug use: Never   Sexual activity: Yes  Other Topics Concern   Not on file  Social History Narrative   Caffeine coffee 3-4 cups daily,  one soda weekly.   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    Social Drivers of Corporate investment banker Strain: Not on file  Food Insecurity: Not on file  Transportation Needs: Not on file  Physical Activity: Not on file  Stress: Not on file  Social Connections: Not on file  Intimate Partner Violence: Not on file    Family History:  Family History  Problem Relation Age of Onset   Thyroid disease Mother    Kidney disease Mother    Heart disease Mother    Thyroid disease Maternal Grandmother    Cancer Father    Heart disease Brother    Diabetes  Paternal Grandmother    Heart disease Paternal Grandmother    Heart disease Paternal Grandfather     Medications:   Current Outpatient Medications on File Prior to Visit  Medication Sig Dispense Refill   amitriptyline (ELAVIL) 150 MG tablet Take 150 mg by mouth at bedtime.     amLODipine (NORVASC) 5 MG tablet Take 5 mg by mouth daily.     beclomethasone (QVAR REDIHALER) 40 MCG/ACT inhaler Inhale 2 puffs into the lungs 2 (two) times daily.     Cholecalciferol (VITAMIN D) 125 MCG (5000 UT) CAPS Take 1 capsule by mouth daily at 6 (six) AM.     Ferrous Sulfate (IRON) 325 (65 Fe) MG TABS Take 1 tablet by mouth daily.     levothyroxine (SYNTHROID) 175 MCG tablet Take 175 mcg by mouth every morning.     montelukast (SINGULAIR) 10 MG tablet Take 10 mg by mouth at bedtime.     olmesartan (BENICAR) 40 MG tablet Take 40 mg by  mouth daily.     omeprazole (PRILOSEC) 20 MG capsule Take 20 mg by mouth 2 (two) times daily before a meal.     promethazine (PHENERGAN) 25 MG tablet Take 1-2 tablets by mouth every 6 (six) hours as needed. Takes 1-2 daily  2   rosuvastatin (CRESTOR) 10 MG tablet Take 10 mg by mouth daily.     valACYclovir (VALTREX) 500 MG tablet Take 500 mg by mouth daily. As needed for outbreaks     VENTOLIN HFA 108 (90 BASE) MCG/ACT inhaler INHALE 1-2 PUFFS EVERY DAY  3   levothyroxine (SYNTHROID) 137 MCG tablet Take 137 mcg by mouth daily before breakfast. (Patient not taking: Reported on 01/26/2024)     olmesartan-hydrochlorothiazide (BENICAR HCT) 40-25 MG tablet Take 1 tablet by mouth daily. (Patient not taking: Reported on 01/26/2024)     No current facility-administered medications on file prior to visit.    Allergies:   Allergies  Allergen Reactions   Topamax [Topiramate] Shortness Of Breath   Morphine And Codeine Anxiety      OBJECTIVE:  Physical Exam  Vitals:   01/26/24 0932  BP: 122/85  Pulse: 71  Weight: 227 lb (103 kg)  Height: 5\' 7"  (1.702 m)   Body mass index is 35.55 kg/m. No results found.  General: well developed, well nourished, very pleasant middle-age female, seated, in no evident distress Head: head normocephalic and atraumatic.   Neck: supple with no carotid or supraclavicular bruits Cardiovascular: regular rate and rhythm, no murmurs Musculoskeletal: no deformity Skin:  no rash/petichiae Vascular:  Normal pulses all extremities   Neurologic Exam Mental Status: Awake and fully alert. Oriented to place and time. Recent and remote memory intact. Attention span, concentration and fund of knowledge appropriate. Mood and affect appropriate.  Cranial Nerves: Pupils equal, briskly reactive to light. Extraocular movements full without nystagmus. Visual fields full to confrontation. Hearing intact. Facial sensation intact. Face, tongue, palate moves normally and symmetrically.   Motor: Normal bulk and tone. Normal strength in all tested extremity muscles Gait and Station: Arises from chair without difficulty. Stance is normal. Gait demonstrates normal stride length and balance without use of AD.  Reflexes: 1+ and symmetric. Toes downgoing.         ASSESSMENT/PLAN: Heather Mcdaniel is a 58 y.o. year old female    OSA on CPAP : Compliance report shows satisfactory nightly usage although suboptimal greater than 4-hour usage.  Discussed importance of nightly usage with ensuring greater than 4 hours per night especially  in setting of recent MI in February.  Continue current pressure settings.  Discussed ways to help improve tolerance and desensitization. Continue to follow with DME company for any needed supplies or CPAP related concerns     Follow up in 1 year or call earlier if needed   CC:  PCP: Lewis Moccasin, MD    I spent 20 minutes of face-to-face and non-face-to-face time with patient.  This included previsit chart review, lab review, study review, order entry, electronic health record documentation, patient education regarding diagnosis of sleep apnea with review and discussion of compliance report and answered all other questions to patient's satisfaction  Ihor Austin, [facility]  [facility] [address] [place], [place] [address]  Phone [phone] Fax [phone] Note: This document was prepared with digital dictation and possible smart phrase technology. Any transcriptional errors that result from this process are unintentional.

## 2024-01-26 ENCOUNTER — Ambulatory Visit: Admitting: Adult Health

## 2024-01-26 ENCOUNTER — Encounter: Payer: Self-pay | Admitting: Adult Health

## 2024-01-26 VITALS — BP 122/85 | HR 71 | Ht 67.0 in | Wt 227.0 lb

## 2024-01-26 DIAGNOSIS — G4733 Obstructive sleep apnea (adult) (pediatric): Secondary | ICD-10-CM

## 2024-01-26 NOTE — Patient Instructions (Addendum)
 Your Plan:  Continue nightly use of CPAP and ensure greater than 4 hours per night for optimal benefit   Continue to follow with your DME for any needed supplies or CPAP related concerns     Follow up in 1 year or call earlier if needed      Thank you for coming to see [place] at [facility]. I hope we have been able to provide you high quality care today.  You may receive a patient satisfaction survey over the next few weeks. We would appreciate your feedback and comments so that we may continue to improve ourselves and the health of our patients.

## 2025-01-31 ENCOUNTER — Ambulatory Visit: Admitting: Adult Health
